# Patient Record
Sex: Male | Born: 1950 | Race: White | Hispanic: No | Marital: Married | Smoking: Never smoker
Health system: Southern US, Community
[De-identification: ages and names within clinical notes are randomized; demographics above are authoritative.]

## PROBLEM LIST (undated history)

## (undated) DIAGNOSIS — I4891 Unspecified atrial fibrillation: Secondary | ICD-10-CM

## (undated) DIAGNOSIS — Z87442 Personal history of urinary calculi: Secondary | ICD-10-CM

## (undated) DIAGNOSIS — M1990 Unspecified osteoarthritis, unspecified site: Secondary | ICD-10-CM

## (undated) DIAGNOSIS — I38 Endocarditis, valve unspecified: Secondary | ICD-10-CM

## (undated) DIAGNOSIS — K219 Gastro-esophageal reflux disease without esophagitis: Secondary | ICD-10-CM

## (undated) DIAGNOSIS — T4145XA Adverse effect of unspecified anesthetic, initial encounter: Secondary | ICD-10-CM

## (undated) DIAGNOSIS — I10 Essential (primary) hypertension: Secondary | ICD-10-CM

## (undated) DIAGNOSIS — K30 Functional dyspepsia: Secondary | ICD-10-CM

## (undated) DIAGNOSIS — E119 Type 2 diabetes mellitus without complications: Secondary | ICD-10-CM

## (undated) DIAGNOSIS — T8859XA Other complications of anesthesia, initial encounter: Secondary | ICD-10-CM

## (undated) HISTORY — DX: Essential (primary) hypertension: I10

## (undated) HISTORY — DX: Functional dyspepsia: K30

## (undated) HISTORY — PX: CARDIAC CATHETERIZATION: SHX172

## (undated) HISTORY — DX: Type 2 diabetes mellitus without complications: E11.9

---

## 2010-01-27 ENCOUNTER — Ambulatory Visit: Payer: Self-pay | Admitting: Cardiovascular Disease

## 2010-06-08 ENCOUNTER — Encounter: Payer: Self-pay | Admitting: Rheumatology

## 2010-06-22 ENCOUNTER — Encounter: Payer: Self-pay | Admitting: Rheumatology

## 2011-02-08 ENCOUNTER — Ambulatory Visit: Payer: Self-pay | Admitting: Orthopedic Surgery

## 2013-05-01 ENCOUNTER — Ambulatory Visit: Payer: Self-pay | Admitting: Pain Medicine

## 2013-05-01 ENCOUNTER — Other Ambulatory Visit: Payer: Self-pay | Admitting: Pain Medicine

## 2013-05-01 LAB — SEDIMENTATION RATE: Erythrocyte Sed Rate: 12 mm/hr (ref 0–20)

## 2013-05-30 ENCOUNTER — Ambulatory Visit: Payer: Self-pay | Admitting: Pain Medicine

## 2013-06-19 ENCOUNTER — Ambulatory Visit: Payer: Self-pay | Admitting: Pain Medicine

## 2014-01-02 ENCOUNTER — Ambulatory Visit: Payer: Self-pay | Admitting: Pain Medicine

## 2014-04-02 ENCOUNTER — Emergency Department: Payer: Self-pay | Admitting: Emergency Medicine

## 2014-04-15 ENCOUNTER — Encounter: Payer: Self-pay | Admitting: *Deleted

## 2014-04-24 ENCOUNTER — Ambulatory Visit (INDEPENDENT_AMBULATORY_CARE_PROVIDER_SITE_OTHER): Payer: BLUE CROSS/BLUE SHIELD | Admitting: General Surgery

## 2014-04-24 ENCOUNTER — Telehealth: Payer: Self-pay | Admitting: *Deleted

## 2014-04-24 ENCOUNTER — Encounter: Payer: Self-pay | Admitting: General Surgery

## 2014-04-24 DIAGNOSIS — K8010 Calculus of gallbladder with chronic cholecystitis without obstruction: Secondary | ICD-10-CM

## 2014-04-24 NOTE — Patient Instructions (Addendum)

## 2014-04-24 NOTE — Progress Notes (Signed)
Patient ID: Connor Stanley, male   DOB: 02/13/1951, 64 y.o.   MRN: [id number]  Chief Complaint  Patient presents with  . Other    gall stones    HPI Connor BrunsVincent Delpino is a 64 y.o. male.  here for evaluation of gall stones. He states that about 3-4 weeks ago he had some vomiting. He states that his tongue was swollen the next morning with sores on it and he went to the ED and states all test were normal. He denies any abdominal pain and no further vomiting. The tongue stayed swollen for about 1 week and is better now. He did have an abdominal ultrasound done 04-09-14 that showed gall stones.  HPI  Past Medical History  Diagnosis Date  . Hypertension   . Acid indigestion     History reviewed. No pertinent past surgical history.  History reviewed. No pertinent family history.  Social History History  Substance Use Topics  . Smoking status: Never Smoker   . Smokeless tobacco: Not on file  . Alcohol Use: No    No Known Allergies  Current Outpatient Prescriptions  Medication Sig Dispense Refill  . carvedilol (COREG) 25 MG tablet Take 25 mg by mouth 2 (two) times daily.  2  . ranitidine (ZANTAC) 150 MG tablet Take 150 mg by mouth 2 (two) times daily.     No current facility-administered medications for this visit.    Review of Systems Review of Systems  Constitutional: Negative.   Respiratory: Negative.   Cardiovascular: Negative.     Blood pressure 120/68, pulse 60, resp. rate 12, height 5' 9.5" (1.765 m), weight 238 lb (107.956 kg).  Physical Exam Physical Exam  Constitutional: He is oriented to person, place, and time. He appears well-developed and well-nourished.  Eyes: Conjunctivae are normal. No scleral icterus.  Neck: Neck supple.  Cardiovascular: Normal rate, regular rhythm and normal heart sounds.   Pulmonary/Chest: Effort normal and breath sounds normal.  Abdominal: Soft. Normal appearance and bowel sounds are normal. There is no hepatomegaly. There is no  tenderness.  Lymphadenopathy:    He has no cervical adenopathy.  Neurological: He is alert and oriented to person, place, and time.  Skin: Skin is warm and dry.    Data Reviewed Office notes.  Assessment     Cholelithiasis with chronic cholecystitis. Pt has not had significant symptoms but he does have moderate wall thickening of gallbladder and multiple stones.  Elective cholecystectomy would be a lot easier than one done urgently in this moderately overweight patient. Gave him all details     Plan    Laparoscopic Cholecystectomy with Intraoperative Cholangiogram. The procedure, including it's potential risks and complications (including but not limited to infection, bleeding, injury to intra-abdominal organs or bile ducts, bile leak, poor cosmetic result, sepsis and death) were discussed with the patient in detail. Non-operative options, including their inherent risks (acute calculous cholecystitis with possible choledocholithiasis or gallstone pancreatitis, with the risk of ascending cholangitis, sepsis, and death) were discussed as well. The patient expressed and understanding of what we discussed and wishes to proceed with laparoscopic cholecystectomy. The patient further understands that if it is technically not possible, or it is unsafe to proceed laparoscopically, that I will convert to an open cholecystectomy.    Patient's [facility].   Stetson Pelaez G 04/24/2014, 2:08 PM

## 2014-04-24 NOTE — Telephone Encounter (Signed)
Patient's wife called the office wanting to reschedule surgery from 05-01-14 to 04-29-14.   Message left on O.R. Posting line regarding date change.

## 2014-04-25 LAB — HEPATIC FUNCTION PANEL
ALK PHOS: 96 IU/L (ref 39–117)
ALT: 29 IU/L (ref 0–44)
AST: 22 IU/L (ref 0–40)
Albumin: 4.4 g/dL (ref 3.6–4.8)
BILIRUBIN TOTAL: 0.7 mg/dL (ref 0.0–1.2)
Bilirubin, Direct: 0.17 mg/dL (ref 0.00–0.40)
Total Protein: 6.9 g/dL (ref 6.0–8.5)

## 2014-04-29 ENCOUNTER — Ambulatory Visit: Payer: Self-pay | Admitting: General Surgery

## 2014-04-29 ENCOUNTER — Encounter: Payer: Self-pay | Admitting: General Surgery

## 2014-04-29 DIAGNOSIS — K8010 Calculus of gallbladder with chronic cholecystitis without obstruction: Secondary | ICD-10-CM | POA: Diagnosis not present

## 2014-04-29 HISTORY — PX: CHOLECYSTECTOMY: SHX55

## 2014-05-01 ENCOUNTER — Encounter: Payer: Self-pay | Admitting: General Surgery

## 2014-05-02 ENCOUNTER — Encounter: Payer: Self-pay | Admitting: General Surgery

## 2014-05-08 ENCOUNTER — Ambulatory Visit (INDEPENDENT_AMBULATORY_CARE_PROVIDER_SITE_OTHER): Payer: BLUE CROSS/BLUE SHIELD | Admitting: General Surgery

## 2014-05-08 ENCOUNTER — Encounter: Payer: Self-pay | Admitting: General Surgery

## 2014-05-08 VITALS — BP 130/70 | HR 74 | Resp 14 | Ht 69.0 in | Wt 236.0 lb

## 2014-05-08 DIAGNOSIS — K8010 Calculus of gallbladder with chronic cholecystitis without obstruction: Secondary | ICD-10-CM

## 2014-05-08 NOTE — Progress Notes (Signed)
This is a 64 year old male here today for his post op gallbladder surgery done on 04/29/14. Patient states he is doing well.  Port sites are clean and healing well. Abdomen is soft, nontender Lungs are clear. Patient to return as needed. No restrictions.

## 2014-05-08 NOTE — Patient Instructions (Signed)
Patient tto return as needed.

## 2014-06-16 LAB — SURGICAL PATHOLOGY

## 2014-06-22 NOTE — Op Note (Signed)
PATIENT NAME:  Connor Stanley, Connor Stanley MR#:  [id number] DATE OF BIRTH:  December 05, 1950  DATE OF PROCEDURE:  04/29/2014  PREOPERATIVE DIAGNOSIS: Chronic cholecystitis and cholelithiasis.   POSTOPERATIVE DIAGNOSIS: Chronic cholecystitis and cholelithiasis.   OPERATION: Laparoscopy and cholecystectomy with intraoperative cholangiogram.   ANESTHESIA: General.   COMPLICATIONS: None.   ESTIMATED BLOOD LOSS: Minimal.   DRAINS: None.   DESCRIPTION OF PROCEDURE: The patient was put to sleep in the supine position on the operating table. The abdomen was prepped and draped out as a sterile field.   Timeout was performed. The port site was made just above the umbilicus with Veress needle, was inserted through the incision into the peritoneal cavity and verified with the hanging drop method. Pneumoperitoneum was obtained and 10 mm port was placed. The camera was introduced with good visualization of the peritoneal cavity. No apparent injury to the underlying structures from initial entry noted. Epigastric and 2 lateral 5 mm ports were placed. The gallbladder was noted to be chronically inflamed with significantly thickened wall, no acute changes were noted. With careful cephalad traction the adhesions were taken down. The Hartmann pouch area was dissected. The cystic duct was freed and a Kumar clamp and catheter were positioned. Cholangiogram was performed which showed good filling of the bile duct both proximal and distal, no apparent filling defects were identified, and no obstruction to flow. The catheter was used to decompress the gallbladder slightly and removed. The cystic duct was hemoclipped, and cut. The cystic artery was identified in its normal position. This was freed, hemoclipped, and cut. The gallbladder was dissected free from its bed using cautery for control of bleeding. With the 5 mm scope used on the epigastric port site the gallbladder was placed in a retrieval bag and brought out through the umbilical  port site. Multiple stones were identified. There was no unusual bleeding. Clips were intact. The fascial opening in the umbilicus was closed with 0 Vicryl placed with a suture passer. A small amount of fluid was used to irrigate out the right upper quadrant and suctioned out. Ports were then removed after release of pneumoperitoneum. The skin incision closed with subcuticular 4-0 Vicryl, reinforced with Steri-Strips, and a dry sterile dressing was placed. The patient tolerated the procedure well. There were no immediate problems encountered. He was extubated and returned to the recovery room in stable condition.      ____________________________ S.Wynona LunaG. Sankar, MD sgs:bu D: 05/01/2014 09:22:31 ET T: 05/01/2014 17:02:16 ET JOB#: [id number]  cc: S.G. Evette CristalSankar, MD, <Dictator> [facility] Wynona LunaG SANKAR MD ELECTRONICALLY SIGNED 05/05/2014 9:55

## 2017-04-18 ENCOUNTER — Other Ambulatory Visit: Payer: Self-pay | Admitting: Specialist

## 2017-04-20 ENCOUNTER — Encounter
Admission: RE | Admit: 2017-04-20 | Discharge: 2017-04-20 | Disposition: A | Discharge status: Home / Self Care | Payer: BLUE CROSS/BLUE SHIELD | Source: Ambulatory Visit | Attending: Specialist | Admitting: Specialist

## 2017-04-20 ENCOUNTER — Other Ambulatory Visit: Payer: Self-pay

## 2017-04-20 HISTORY — DX: Unspecified osteoarthritis, unspecified site: M19.90

## 2017-04-20 HISTORY — DX: Gastro-esophageal reflux disease without esophagitis: K21.9

## 2017-04-20 HISTORY — DX: Personal history of urinary calculi: Z87.442

## 2017-04-20 HISTORY — DX: Adverse effect of unspecified anesthetic, initial encounter: T41.45XA

## 2017-04-20 HISTORY — DX: Endocarditis, valve unspecified: I38

## 2017-04-20 HISTORY — DX: Other complications of anesthesia, initial encounter: T88.59XA

## 2017-04-20 NOTE — Patient Instructions (Signed)
Your procedure is scheduled on: 04-24-17 Report to Same Day Surgery 2nd floor medical mall [facility] Entrance-take elevator on left to 2nd floor.  Check in with surgery information desk.) To find out your arrival time please call [phone] between 1PM - 3PM on 04-21-17  Remember: Instructions that are not followed completely may result in serious medical risk, up to and including death, or upon the discretion of your surgeon and anesthesiologist your surgery may need to be rescheduled.    _x___ 1. Do not eat food after midnight the night before your procedure. NO GUM OR CANDY AFTER MIDNIGHT.  You may drink clear liquids up to 2 hours before you are scheduled to arrive at the hospital for your procedure.  Do not drink clear liquids within 2 hours of your scheduled arrival to the hospital.  Clear liquids include  --Water or Apple juice without pulp  --Clear carbohydrate beverage such as ClearFast or Gatorade  --Black Coffee or Clear Tea (No milk, no creamers, do not add anything to the coffee or Tea     __x__ 2. No Alcohol for 24 hours before or after surgery.   __x__3. No Smoking or e-cigarettes for 24 prior to surgery.  Do not use any chewable tobacco products for at least 6 hour prior to surgery   ____  4. Bring all medications with you on the day of surgery if instructed.    __x__ 5. Notify your doctor if there is any change in your medical condition     (cold, fever, infections).    x___6. On the morning of surgery brush your teeth with toothpaste and water.  You may rinse your mouth with mouth wash if you wish.  Do not swallow any toothpaste or mouthwash.   Do not wear jewelry, make-up, hairpins, clips or nail polish.  Do not wear lotions, powders, or perfumes. You may wear deodorant.  Do not shave 48 hours prior to surgery. Men may shave face and neck.  Do not bring valuables to the hospital.    [facility] is not responsible for any belongings or valuables.    Contacts, dentures or bridgework may not be worn into surgery.  Leave your suitcase in the car. After surgery it may be brought to your room.  For patients admitted to the hospital, discharge time is determined by your treatment team.  _  Patients discharged the day of surgery will not be allowed to drive home.  You will need someone to drive you home and stay with you the night of your procedure.    Please read over the following fact sheets that you were given:   [facility] Preparing for Surgery and or MRSA Information   _x___ TAKE THE FOLLOWING MEDICATION THE MORNING OF SURGERY WITH A SMALL SIP OF WATER. These include:  1. COREG  2.  3.  4.  5.  6.  ____Fleets enema or Magnesium Citrate as directed.   _x___ Use CHG Soap or sage wipes as directed on instruction sheet   ____ Use inhalers on the day of surgery and bring to hospital day of surgery  ____ Stop Metformin and Janumet 2 days prior to surgery.    ____ Take 1/2 of usual insulin dose the night before surgery and none on the morning surgery.   ____ Follow recommendations from Cardiologist, Pulmonologist or PCP regarding  stopping Aspirin, Coumadin, Plavix ,Eliquis, Effient, or Pradaxa, and Pletal.  X____Stop Anti-inflammatories such as Advil, Aleve, Ibuprofen, Motrin, Naproxen,MELOXICAM,  Naprosyn,  Goodies powders or aspirin products NOW-OK to take Tylenol    ____ Stop supplements until after surgery.     ____ Bring C-Pap to the hospital.

## 2017-04-24 ENCOUNTER — Encounter: Admission: RE | Payer: Self-pay | Source: Ambulatory Visit

## 2017-04-24 ENCOUNTER — Ambulatory Visit: Admission: RE | Admit: 2017-04-24 | Payer: BLUE CROSS/BLUE SHIELD | Source: Ambulatory Visit | Admitting: Specialist

## 2017-04-24 SURGERY — CARPOMETACARPAL (CMC) FUSION OF THUMB
Anesthesia: Choice | Laterality: Left

## 2017-05-23 ENCOUNTER — Other Ambulatory Visit: Payer: Self-pay

## 2017-05-23 ENCOUNTER — Emergency Department: Payer: BLUE CROSS/BLUE SHIELD

## 2017-05-23 ENCOUNTER — Observation Stay
Admission: EM | Admit: 2017-05-23 | Discharge: 2017-05-24 | Disposition: A | Discharge status: Home / Self Care | Payer: BLUE CROSS/BLUE SHIELD | Attending: Internal Medicine | Admitting: Internal Medicine

## 2017-05-23 ENCOUNTER — Encounter: Payer: Self-pay | Admitting: Emergency Medicine

## 2017-05-23 DIAGNOSIS — Z8249 Family history of ischemic heart disease and other diseases of the circulatory system: Secondary | ICD-10-CM | POA: Diagnosis not present

## 2017-05-23 DIAGNOSIS — I480 Paroxysmal atrial fibrillation: Secondary | ICD-10-CM | POA: Diagnosis not present

## 2017-05-23 DIAGNOSIS — K219 Gastro-esophageal reflux disease without esophagitis: Secondary | ICD-10-CM | POA: Diagnosis not present

## 2017-05-23 DIAGNOSIS — E860 Dehydration: Secondary | ICD-10-CM | POA: Insufficient documentation

## 2017-05-23 DIAGNOSIS — Z791 Long term (current) use of non-steroidal anti-inflammatories (NSAID): Secondary | ICD-10-CM | POA: Insufficient documentation

## 2017-05-23 DIAGNOSIS — J101 Influenza due to other identified influenza virus with other respiratory manifestations: Secondary | ICD-10-CM | POA: Diagnosis present

## 2017-05-23 DIAGNOSIS — J188 Other pneumonia, unspecified organism: Secondary | ICD-10-CM | POA: Insufficient documentation

## 2017-05-23 DIAGNOSIS — R2681 Unsteadiness on feet: Secondary | ICD-10-CM

## 2017-05-23 DIAGNOSIS — R42 Dizziness and giddiness: Secondary | ICD-10-CM | POA: Insufficient documentation

## 2017-05-23 DIAGNOSIS — J1000 Influenza due to other identified influenza virus with unspecified type of pneumonia: Principal | ICD-10-CM | POA: Insufficient documentation

## 2017-05-23 DIAGNOSIS — N289 Disorder of kidney and ureter, unspecified: Secondary | ICD-10-CM

## 2017-05-23 DIAGNOSIS — R269 Unspecified abnormalities of gait and mobility: Secondary | ICD-10-CM | POA: Insufficient documentation

## 2017-05-23 DIAGNOSIS — I10 Essential (primary) hypertension: Secondary | ICD-10-CM | POA: Insufficient documentation

## 2017-05-23 DIAGNOSIS — N179 Acute kidney failure, unspecified: Secondary | ICD-10-CM

## 2017-05-23 DIAGNOSIS — Z87442 Personal history of urinary calculi: Secondary | ICD-10-CM | POA: Diagnosis not present

## 2017-05-23 DIAGNOSIS — J189 Pneumonia, unspecified organism: Secondary | ICD-10-CM

## 2017-05-23 HISTORY — DX: Unspecified atrial fibrillation: I48.91

## 2017-05-23 LAB — COMPREHENSIVE METABOLIC PANEL
ALK PHOS: 71 U/L (ref 38–126)
ALT: 28 U/L (ref 17–63)
AST: 31 U/L (ref 15–41)
Albumin: 4 g/dL (ref 3.5–5.0)
Anion gap: 7 (ref 5–15)
BUN: 17 mg/dL (ref 6–20)
CALCIUM: 8.5 mg/dL — AB (ref 8.9–10.3)
CHLORIDE: 106 mmol/L (ref 101–111)
CO2: 23 mmol/L (ref 22–32)
Creatinine, Ser: 1.26 mg/dL — ABNORMAL HIGH (ref 0.61–1.24)
GFR calc Af Amer: >60 mL/min (ref >60)
GFR calc non Af Amer: 57 mL/min — ABNORMAL LOW (ref >60)
Glucose, Bld: 137 mg/dL — ABNORMAL HIGH (ref 65–99)
Potassium: 3.6 mmol/L (ref 3.5–5.1)
SODIUM: 136 mmol/L (ref 135–145)
Total Bilirubin: 1.1 mg/dL (ref 0.3–1.2)
Total Protein: 7.1 g/dL (ref 6.5–8.1)

## 2017-05-23 LAB — CBC WITH DIFFERENTIAL/PLATELET
BASOS ABS: 0 10*3/uL (ref 0–0.1)
Basophils Relative: 0 %
EOS ABS: 0 10*3/uL (ref 0–0.7)
Eosinophils Relative: 0 %
HCT: 39.4 % — ABNORMAL LOW (ref 40.0–52.0)
HEMOGLOBIN: 13.4 g/dL (ref 13.0–18.0)
LYMPHS ABS: 0.5 10*3/uL — AB (ref 1.0–3.6)
LYMPHS PCT: 6 %
MCH: 30 pg (ref 26.0–34.0)
MCHC: 33.9 g/dL (ref 32.0–36.0)
MCV: 88.5 fL (ref 80.0–100.0)
Monocytes Absolute: 0.9 10*3/uL (ref 0.2–1.0)
Monocytes Relative: 11 %
NEUTROS PCT: 83 %
Neutro Abs: 6.9 10*3/uL — ABNORMAL HIGH (ref 1.4–6.5)
PLATELETS: 147 10*3/uL — AB (ref 150–440)
RBC: 4.46 MIL/uL (ref 4.40–5.90)
RDW: 13.5 % (ref 11.5–14.5)
WBC: 8.3 10*3/uL (ref 3.8–10.6)

## 2017-05-23 LAB — URINALYSIS, COMPLETE (UACMP) WITH MICROSCOPIC
BACTERIA UA: NONE SEEN
Bilirubin Urine: NEGATIVE
GLUCOSE, UA: NEGATIVE mg/dL
Hgb urine dipstick: NEGATIVE
KETONES UR: NEGATIVE mg/dL
Leukocytes, UA: NEGATIVE
Nitrite: NEGATIVE
PROTEIN: 30 mg/dL — AB
Specific Gravity, Urine: >1.046 — ABNORMAL HIGH (ref 1.005–1.030)
pH: 5 (ref 5.0–8.0)

## 2017-05-23 LAB — LACTIC ACID, PLASMA: Lactic Acid, Venous: 0.9 mmol/L (ref 0.5–1.9)

## 2017-05-23 LAB — INFLUENZA PANEL BY PCR (TYPE A & B)
INFLAPCR: POSITIVE — AB
INFLBPCR: NEGATIVE

## 2017-05-23 LAB — TROPONIN I: Troponin I: <0.03 ng/mL (ref <0.03)

## 2017-05-23 MED ORDER — MECLIZINE HCL 25 MG PO TABS
50.0000 mg | ORAL_TABLET | Freq: Once | ORAL | Status: AC
  Administered 2017-05-23: 50 mg via ORAL
  Filled 2017-05-23: qty 2

## 2017-05-23 MED ORDER — SODIUM CHLORIDE 0.9 % IV BOLUS
1000.0000 mL | Freq: Once | INTRAVENOUS | Status: AC
  Administered 2017-05-23: 1000 mL via INTRAVENOUS

## 2017-05-23 MED ORDER — MELOXICAM 7.5 MG PO TABS
7.5000 mg | ORAL_TABLET | Freq: Two times a day (BID) | ORAL | Status: DC
  Administered 2017-05-24: 7.5 mg via ORAL
  Filled 2017-05-23 (×3): qty 1

## 2017-05-23 MED ORDER — IOHEXOL 350 MG/ML SOLN
75.0000 mL | Freq: Once | INTRAVENOUS | Status: AC | PRN
  Administered 2017-05-23: 75 mL via INTRAVENOUS
  Filled 2017-05-23: qty 75

## 2017-05-23 MED ORDER — LEVOFLOXACIN 500 MG PO TABS: 500.0000 mg | ORAL_TABLET | Freq: Every day | ORAL | Status: DC

## 2017-05-23 MED ORDER — ACETAMINOPHEN 500 MG PO TABS
ORAL_TABLET | ORAL | Status: AC
  Administered 2017-05-23: 1000 mg
  Filled 2017-05-23: qty 2

## 2017-05-23 MED ORDER — RIVAROXABAN 20 MG PO TABS
20.0000 mg | ORAL_TABLET | Freq: Every day | ORAL | Status: DC
  Administered 2017-05-24: 10:00:00 20 mg via ORAL
  Filled 2017-05-23: qty 1

## 2017-05-23 MED ORDER — ACETAMINOPHEN 325 MG PO TABS: 650.0000 mg | ORAL_TABLET | Freq: Four times a day (QID) | ORAL | Status: DC | PRN

## 2017-05-23 MED ORDER — OSELTAMIVIR PHOSPHATE 75 MG PO CAPS
75.0000 mg | ORAL_CAPSULE | Freq: Two times a day (BID) | ORAL | Status: DC
  Administered 2017-05-24: 10:00:00 75 mg via ORAL
  Filled 2017-05-23 (×2): qty 1

## 2017-05-23 MED ORDER — SODIUM CHLORIDE 0.9 % IV SOLN
INTRAVENOUS | Status: DC
  Administered 2017-05-23: 21:00:00 via INTRAVENOUS

## 2017-05-23 MED ORDER — LEVOFLOXACIN IN D5W 750 MG/150ML IV SOLN
750.0000 mg | Freq: Once | INTRAVENOUS | Status: AC
  Administered 2017-05-23: 750 mg via INTRAVENOUS
  Filled 2017-05-23: qty 150

## 2017-05-23 MED ORDER — IOPAMIDOL (ISOVUE-370) INJECTION 76%
75.0000 mL | Freq: Once | INTRAVENOUS | Status: DC | PRN
  Filled 2017-05-23: qty 75

## 2017-05-23 MED ORDER — DOCUSATE SODIUM 100 MG PO CAPS
100.0000 mg | ORAL_CAPSULE | Freq: Two times a day (BID) | ORAL | Status: DC | PRN
Start: 2017-05-23 — End: 2017-05-24

## 2017-05-23 MED ORDER — OSELTAMIVIR PHOSPHATE 75 MG PO CAPS
75.0000 mg | ORAL_CAPSULE | Freq: Once | ORAL | Status: AC
  Administered 2017-05-23: 75 mg via ORAL
  Filled 2017-05-23: qty 1

## 2017-05-23 NOTE — ED Provider Notes (Signed)
[facility] Emergency Department Provider Note  ____________________________________________  Time seen: Approximately 5:16 PM  I have reviewed the triage vital signs and the nursing notes.   HISTORY  Chief Complaint Dizziness    HPI Connor Stanley is a 67 y.o. male with a fairly new diagnosis of atrial fibrillation on carvedilol and Xarelto, HTN, presenting with lightheadedness, difficulty walking, cough and cold symptoms.  The patient reports that for the past several days he has had a "cold" with congestion and nonproductive cough.  He is also intermittently had subjective fevers which are responsive to Tylenol.  He has had decreased p.o. intake because of his symptoms.  He reports that he has developed a lightheaded sensation that is worse if he stands up quickly, or if he bends down and then stands up quickly.  He has not had room spinning sensation.  He feels that he is walking "like I am drunk," banging into the wall wherever he goes.  He has not had any nausea vomiting or diarrhea, urinary symptoms, abdominal pain.  Patient denies any palpitations, syncope, chest pain or shortness of breath.  Past Medical History:  Diagnosis Date  . A-fib (HCC)   . Acid indigestion   . Arthritis   . Complication of anesthesia    HARD TO WAKE UP  . GERD (gastroesophageal reflux disease)    RARE-NO MEDS  . History of kidney stones    H/O  . Hypertension   . Leaky heart valve    PT STATES HE HAD HEART CATH AND IT SHOWED ONE OF HIS VALVES DID NOT CLOSE PROPERLY-    There are no active problems to display for this patient.   Past Surgical History:  Procedure Laterality Date  . CARDIAC CATHETERIZATION    . CHOLECYSTECTOMY  04/29/14    Current Outpatient Rx  . Order #: [id number] Class: Historical Med  . Order #: [id number] Class: Historical Med  . Order #: [id number] Class: Historical Med  . Order #: [id number] Class: Historical Med    Allergies Patient has no known  allergies.  History reviewed. No pertinent family history.  Social History Social History   Tobacco Use  . Smoking status: Never Smoker  . Smokeless tobacco: Never Used  Substance Use Topics  . Alcohol use: No    Alcohol/week: 0.0 oz  . Drug use: No    Review of Systems Constitutional: Positive fevers.  Positive lightheadedness without syncope.  No dizziness. Eyes: No visual changes.  No eye discharge. ENT: No sore throat.  Positive congestion or rhinorrhea.  No ear pain. Cardiovascular: Denies chest pain. Denies palpitations. Respiratory: Denies shortness of breath.  Positive cough. Gastrointestinal: No abdominal pain.  No nausea, no vomiting.  No diarrhea.  No constipation. Genitourinary: Negative for dysuria. Musculoskeletal: Negative for back pain. Skin: Negative for rash. Neurological: Negative for headaches. No focal numbness, tingling or weakness.  Positive difficulty walking due to unsteady gait.    ____________________________________________   PHYSICAL EXAM:  VITAL SIGNS: ED Triage Vitals  Enc Vitals Group     BP 05/23/17 1334 120/70     Pulse Rate 05/23/17 1334 82     Resp 05/23/17 1334 16     Temp 05/23/17 1334 99.5 F (37.5 C)     Temp Source 05/23/17 1334 Oral     SpO2 05/23/17 1334 98 %     Weight 05/23/17 1330 230 lb (104.3 kg)     Height 05/23/17 1330 5\' 9"  (1.753 m)     Head Circumference --  Peak Flow --      Pain Score 05/23/17 1335 0     Pain Loc --      Pain Edu? --      Excl. in GC? --     Constitutional: Alert and oriented.  Conically ill appearing but in no acute distress. Answers questions appropriately. Eyes: Conjunctivae are normal.  EOMI. PERRLA.  No horizontal or vertical nystagmus.  No scleral icterus. Head: Atraumatic. Nose: No congestion/rhinnorhea. Mouth/Throat: Mucous membranes are mildly dry.  Neck: No stridor.  Supple.  No JVD.  No meningismus. Cardiovascular: Normal rate, regular rhythm. No murmurs, rubs or  gallops.  Respiratory: Normal respiratory effort.  No accessory muscle use or retractions. Lungs CTAB.  No wheezes, rales or ronchi. Gastrointestinal: Overweight.  Soft, nontender and nondistended.  No guarding or rebound.  No peritoneal signs. Musculoskeletal: No LE edema. No ttp in the calves or palpable cords.  Negative Homan's sign. Neurologic: Alert and oriented 3. Speech is clear.  Face and smile symmetric. Tongue deviates slightly towards the left.  EOMI.  PERRLA.  No horizontal or vertical nystagmus.   No pronator drift. 5 out of 5 grip, biceps, triceps, hip flexors, plantar flexion and dorsiflexion. Normal sensation to light touch in the bilateral upper and lower extremities, and face.  Abnormal gait with some unsteadiness when the patient turns around. Skin:  Skin is warm, dry and intact. No rash noted. Psychiatric: Mood and affect are normal. Speech and behavior are normal.  Normal judgement  ____________________________________________   LABS (all labs ordered are listed, but only abnormal results are displayed)  Labs Reviewed  CBC WITH DIFFERENTIAL/PLATELET - Abnormal; Notable for the following components:      Result Value   HCT 39.4 (*)    Platelets 147 (*)    Neutro Abs 6.9 (*)    Lymphs Abs 0.5 (*)    All other components within normal limits  COMPREHENSIVE METABOLIC PANEL - Abnormal; Notable for the following components:   Glucose, Bld 137 (*)    Creatinine, Ser 1.26 (*)    Calcium 8.5 (*)    GFR calc non Af Amer 57 (*)    All other components within normal limits  INFLUENZA PANEL BY PCR (TYPE A & B) - Abnormal; Notable for the following components:   Influenza A By PCR POSITIVE (*)    All other components within normal limits  CULTURE, BLOOD (ROUTINE X 2)  CULTURE, BLOOD (ROUTINE X 2)  TROPONIN I  URINALYSIS, COMPLETE (UACMP) WITH MICROSCOPIC  LACTIC ACID, PLASMA  LACTIC ACID, PLASMA   ____________________________________________  EKG  ED ECG REPORT I,  Rockne Menghini, the attending physician, personally viewed and interpreted this ECG.   Date: 05/23/2017  EKG Time: 1344  Rate: 79  Rhythm: normal sinus rhythm  Axis: normal  Intervals:none  ST&T Change: No STEMI  ____________________________________________  RADIOLOGY  Ct Angio Head W Or Wo Contrast  Result Date: 05/23/2017 CLINICAL DATA:  67 y/o M; intermittent issues with dizziness and balance for 2-3 days. EXAM: CT ANGIOGRAPHY HEAD AND NECK TECHNIQUE: Multidetector CT imaging of the head and neck was performed using the standard protocol during bolus administration of intravenous contrast. Multiplanar CT image reconstructions and MIPs were obtained to evaluate the vascular anatomy. Carotid stenosis measurements (when applicable) are obtained utilizing NASCET criteria, using the distal internal carotid diameter as the denominator. CONTRAST:  75 cc Isovue 350. COMPARISON:  None. FINDINGS: CT HEAD FINDINGS Brain: No evidence of acute infarction, hemorrhage, hydrocephalus, extra-axial collection  or mass lesion/mass effect. Several nonspecific foci of hypoattenuation in subcortical white matter are are compatible with moderate chronic microvascular ischemic changes for age. Mild brain parenchymal volume loss. Vascular: No hyperdense vessel or unexpected calcification. Skull: Normal. Negative for fracture or focal lesion. Sinuses: Mild ethmoid and left maxillary sinus mucosal thickening and small mucous retention cyst in the left maxillary sinus. Normal aeration of mastoid air cells. Orbits: No acute finding. Review of the MIP images confirms the above findings CTA NECK FINDINGS Aortic arch: Standard branching. Imaged portion shows no evidence of aneurysm or dissection. No significant stenosis of the major arch vessel origins. Mild calcific atherosclerosis. Right carotid system: No evidence of dissection, stenosis (50% or greater) or occlusion. Left carotid system: No evidence of dissection,  stenosis (50% or greater) or occlusion. Vertebral arteries: Codominant. No evidence of dissection, stenosis (50% or greater) or occlusion. Skeleton: Cervical spondylosis with prominent discogenic degenerative changes greatest at the C6-7 level. Uncovertebral and facet hypertrophy results in right-sided bony foraminal stenosis at C5-C7 and left-sided stenosis at C6-7. Additionally, there is a large C6-7 disc osteophyte complex with approximately moderate canal stenosis. Other neck: 13 mm nodule in right lobe of thyroid. Upper chest: Negative. Review of the MIP images confirms the above findings CTA HEAD FINDINGS Anterior circulation: No significant stenosis, proximal occlusion, aneurysm, or vascular malformation. Posterior circulation: No significant stenosis, proximal occlusion, aneurysm, or vascular malformation. Venous sinuses: As permitted by contrast timing, patent. Anatomic variants: Complete circle-of-Willis. Delayed phase: No abnormal intracranial enhancement. Review of the MIP images confirms the above findings IMPRESSION: 1. No acute intracranial abnormality identified. No abnormal enhancement of the brain. 2. Patent carotid and vertebral arteries. No dissection, aneurysm, or hemodynamically significant stenosis utilizing NASCET criteria. 3. Patent anterior and posterior intracranial circulation. No large vessel occlusion, aneurysm, or significant stenosis. 4. Moderate chronic microvascular ischemic changes and mild parenchymal volume loss of the brain. 5. Cervical spondylosis greatest at C6-7 where there is approximately moderate canal stenosis and bilateral foraminal stenosis. Electronically Signed   By: Mitzi Hansen M.D.   On: 05/23/2017 18:24   Dg Chest 2 View  Result Date: 05/23/2017 CLINICAL DATA:  Cough EXAM: CHEST - 2 VIEW COMPARISON:  None. FINDINGS: Normal heart size. Normal mediastinal contour. No pneumothorax. No pleural effusion. Mild patchy lingular opacity obscuring the left  heart border. Curvilinear opacities at both lung bases. No pulmonary edema. No acute consolidative airspace disease. IMPRESSION: Mild patchy lingular opacity obscuring the left heart border, suggesting lingular pneumonia. Recommend follow-up PA and lateral post treatment chest radiographs in 4-6 weeks. Curvilinear bibasilar lung opacities compatible with mild scarring or atelectasis. Electronically Signed   By: Delbert Phenix M.D.   On: 05/23/2017 18:15   Ct Angio Neck W And/or Wo Contrast  Result Date: 05/23/2017 CLINICAL DATA:  67 y/o M; intermittent issues with dizziness and balance for 2-3 days. EXAM: CT ANGIOGRAPHY HEAD AND NECK TECHNIQUE: Multidetector CT imaging of the head and neck was performed using the standard protocol during bolus administration of intravenous contrast. Multiplanar CT image reconstructions and MIPs were obtained to evaluate the vascular anatomy. Carotid stenosis measurements (when applicable) are obtained utilizing NASCET criteria, using the distal internal carotid diameter as the denominator. CONTRAST:  75 cc Isovue 350. COMPARISON:  None. FINDINGS: CT HEAD FINDINGS Brain: No evidence of acute infarction, hemorrhage, hydrocephalus, extra-axial collection or mass lesion/mass effect. Several nonspecific foci of hypoattenuation in subcortical white matter are are compatible with moderate chronic microvascular ischemic changes for age. Mild brain  parenchymal volume loss. Vascular: No hyperdense vessel or unexpected calcification. Skull: Normal. Negative for fracture or focal lesion. Sinuses: Mild ethmoid and left maxillary sinus mucosal thickening and small mucous retention cyst in the left maxillary sinus. Normal aeration of mastoid air cells. Orbits: No acute finding. Review of the MIP images confirms the above findings CTA NECK FINDINGS Aortic arch: Standard branching. Imaged portion shows no evidence of aneurysm or dissection. No significant stenosis of the major arch vessel origins.  Mild calcific atherosclerosis. Right carotid system: No evidence of dissection, stenosis (50% or greater) or occlusion. Left carotid system: No evidence of dissection, stenosis (50% or greater) or occlusion. Vertebral arteries: Codominant. No evidence of dissection, stenosis (50% or greater) or occlusion. Skeleton: Cervical spondylosis with prominent discogenic degenerative changes greatest at the C6-7 level. Uncovertebral and facet hypertrophy results in right-sided bony foraminal stenosis at C5-C7 and left-sided stenosis at C6-7. Additionally, there is a large C6-7 disc osteophyte complex with approximately moderate canal stenosis. Other neck: 13 mm nodule in right lobe of thyroid. Upper chest: Negative. Review of the MIP images confirms the above findings CTA HEAD FINDINGS Anterior circulation: No significant stenosis, proximal occlusion, aneurysm, or vascular malformation. Posterior circulation: No significant stenosis, proximal occlusion, aneurysm, or vascular malformation. Venous sinuses: As permitted by contrast timing, patent. Anatomic variants: Complete circle-of-Willis. Delayed phase: No abnormal intracranial enhancement. Review of the MIP images confirms the above findings IMPRESSION: 1. No acute intracranial abnormality identified. No abnormal enhancement of the brain. 2. Patent carotid and vertebral arteries. No dissection, aneurysm, or hemodynamically significant stenosis utilizing NASCET criteria. 3. Patent anterior and posterior intracranial circulation. No large vessel occlusion, aneurysm, or significant stenosis. 4. Moderate chronic microvascular ischemic changes and mild parenchymal volume loss of the brain. 5. Cervical spondylosis greatest at C6-7 where there is approximately moderate canal stenosis and bilateral foraminal stenosis. Electronically Signed   By: Mitzi Hansen M.D.   On: 05/23/2017 18:24    ____________________________________________   PROCEDURES  Procedure(s)  performed: None  Procedures  Critical Care performed: No ____________________________________________   INITIAL IMPRESSION / ASSESSMENT AND PLAN / ED COURSE  Pertinent labs & imaging results that were available during my care of the patient were reviewed by me and considered in my medical decision making (see chart for details).  67 y.o. male with A. fib on Xarelto presenting with lightheadedness, unsteady gait, in the setting of cough and cold symptoms.  Overall, it is possible that the patient became hypovolemic due to decreased p.o. intake in the setting of a viral illness.  We will get a chest x-ray to rule out pneumonia and influenza swab to check for flu.  Will also rule out UTI.  However, when I ambulate the patient, he was unsteady in his gait, so we will get a CT of the head and CT angiogram of the head neck to rule out stroke.  The patient's EKG is reassuring without any evidence of arrhythmia and he has a negative troponin; the likelihood of ACS or MI is extremely low.  The patient is on Xarelto but has no evidence of anemia which could be causing his symptoms today.  I will plan to treat the patient symptomatically, and reevaluate the patient for final disposition.  ----------------------------------------- 6:42 PM on 05/23/2017 -----------------------------------------  The patient is positive for influenza A and also has a lingular pneumonia.  He has renal insufficiency today and gait instability with walking.  While here, he developed a fever to 103.3.  He does meet criteria  for inpatient hospitalization, blood cultures will be drawn now that we have this new information and the fever, and the patient will be admitted to the hospitalist.  He will receive Tamiflu and Levaquin for his infections.  ____________________________________________  FINAL CLINICAL IMPRESSION(S) / ED DIAGNOSES  Final diagnoses:  Influenza A  Lingular pneumonia  Gait instability  Acute renal  insufficiency         NEW MEDICATIONS STARTED DURING THIS VISIT:  New Prescriptions   No medications on file      Rockne Menghini, MD 05/23/17 1843

## 2017-05-23 NOTE — H&P (Signed)
[facility] [place] [facility]   PATIENT NAME: Connor Stanley    MR#:  [id number]  DATE OF BIRTH:  07/12/1950  DATE OF ADMISSION:  05/23/2017  PRIMARY CARE PHYSICIAN: Sherron Monday, MD   REQUESTING/REFERRING PHYSICIAN: Sharma Covert  CHIEF COMPLAINT:   Chief Complaint  Patient presents with  . Dizziness    HISTORY OF PRESENT ILLNESS: Connor Stanley  is a 67 y.o. male with a known history of A. Fib, Htn, GERD, had cough, fever, chills for last 2 days. He did not eat much in last 2 days because of not feeling well. Came to emergency room today as the complaint continues and he started feeling somewhat dizzy. On workup in ER he is noted to have influenza A and pneumonia on chest x-ray and has mild renal insufficiency. His orthostatic vital signs are stable but on trial due to getting up and walk in the room he felt extremely dizzy and was about to fall while ER physician was assessing him. Concerned with this she suggested to keep this patient in Hospital for tonight and give some IV fluids.  PAST MEDICAL HISTORY:   Past Medical History:  Diagnosis Date  . A-fib (HCC)   . Acid indigestion   . Arthritis   . Complication of anesthesia    HARD TO WAKE UP  . GERD (gastroesophageal reflux disease)    RARE-NO MEDS  . History of kidney stones    H/O  . Hypertension   . Leaky heart valve    PT STATES HE HAD HEART CATH AND IT SHOWED ONE OF HIS VALVES DID NOT CLOSE PROPERLY-    PAST SURGICAL HISTORY:  Past Surgical History:  Procedure Laterality Date  . CARDIAC CATHETERIZATION    . CHOLECYSTECTOMY  04/29/14    SOCIAL HISTORY:  Social History   Tobacco Use  . Smoking status: Never Smoker  . Smokeless tobacco: Never Used  Substance Use Topics  . Alcohol use: No    Alcohol/week: 0.0 oz    FAMILY HISTORY:  Family History  Problem Relation Age of Onset  . Hypertension Mother     DRUG ALLERGIES: No Known Allergies  REVIEW OF SYSTEMS:   CONSTITUTIONAL: No  fever,positive for fatigue or weakness.  EYES: No blurred or double vision.  EARS, NOSE, AND THROAT: No tinnitus or ear pain.  RESPIRATORY: Have cough, shortness of breath, no wheezing or hemoptysis.  CARDIOVASCULAR: No chest pain, orthopnea, edema.  GASTROINTESTINAL: No nausea, vomiting, diarrhea or abdominal pain.  GENITOURINARY: No dysuria, hematuria.  ENDOCRINE: No polyuria, nocturia,  HEMATOLOGY: No anemia, easy bruising or bleeding SKIN: No rash or lesion. MUSCULOSKELETAL: No joint pain or arthritis.   NEUROLOGIC: No tingling, numbness, weakness.  PSYCHIATRY: No anxiety or depression.   MEDICATIONS AT HOME:  Prior to Admission medications   Medication Sig Start Date End Date Taking? Authorizing Provider  carvedilol (COREG) 25 MG tablet Take 12.5 mg by mouth 2 (two) times daily.  04/15/14   [provider]  meloxicam (MOBIC) 15 MG tablet Take 7.5 mg by mouth 2 (two) times daily.     [provider]  Multiple Vitamins-Minerals (HM MULTIVITAMIN ADULT GUMMY PO) Take 2 each by mouth daily.    [provider]  UNABLE TO FIND Take 1 capsule by mouth daily. Med Name: CBD CAPSULE    [provider]      PHYSICAL EXAMINATION:   VITAL SIGNS: Blood pressure 120/70, pulse 82, temperature (!) 103.3 F (39.6 C), temperature source  Oral, resp. rate 16, height 5\' 9"  (1.753 m), weight 104.3 kg (230 lb), SpO2 98 %.  GENERAL:  67 y.o.-year-old patient lying in the bed with no acute distress.  EYES: Pupils equal, round, reactive to light and accommodation. No scleral icterus. Extraocular muscles intact.  HEENT: Head atraumatic, normocephalic. Oropharynx and nasopharynx clear.  NECK:  Supple, no jugular venous distention. No thyroid enlargement, no tenderness.  LUNGS: Normal breath sounds bilaterally, no wheezing, have crepitation. No use of accessory muscles of respiration.  CARDIOVASCULAR: S1, S2 normal. No murmurs, rubs, or gallops.  ABDOMEN: Soft, nontender,  nondistended. Bowel sounds present. No organomegaly or mass.  EXTREMITIES: No pedal edema, cyanosis, or clubbing.  NEUROLOGIC: Cranial nerves II through XII are intact. Muscle strength 5/5 in all extremities. Sensation intact. Gait not checked.  PSYCHIATRIC: The patient is alert and oriented x 3.  SKIN: No obvious rash, lesion, or ulcer.   LABORATORY PANEL:   CBC Recent Labs  Lab 05/23/17 1347  WBC 8.3  HGB 13.4  HCT 39.4*  PLT 147*  MCV 88.5  MCH 30.0  MCHC 33.9  RDW 13.5  LYMPHSABS 0.5*  MONOABS 0.9  EOSABS 0.0  BASOSABS 0.0   ------------------------------------------------------------------------------------------------------------------  Chemistries  Recent Labs  Lab 05/23/17 1347  NA 136  K 3.6  CL 106  CO2 23  GLUCOSE 137*  BUN 17  CREATININE 1.26*  CALCIUM 8.5*  AST 31  ALT 28  ALKPHOS 71  BILITOT 1.1   ------------------------------------------------------------------------------------------------------------------ estimated creatinine clearance is 67.7 mL/min (A) (by C-G formula based on SCr of 1.26 mg/dL (H)). ------------------------------------------------------------------------------------------------------------------ No results for input(s): TSH, T4TOTAL, T3FREE, THYROIDAB in the last 72 hours.  Invalid input(s): FREET3   Coagulation profile No results for input(s): INR, PROTIME in the last 168 hours. ------------------------------------------------------------------------------------------------------------------- No results for input(s): DDIMER in the last 72 hours. -------------------------------------------------------------------------------------------------------------------  Cardiac Enzymes Recent Labs  Lab 05/23/17 1347  TROPONINI <0.03   ------------------------------------------------------------------------------------------------------------------ Invalid input(s):  POCBNP  ---------------------------------------------------------------------------------------------------------------  Urinalysis    Component Value Date/Time   COLORURINE AMBER (A) 05/23/2017 1824   APPEARANCEUR HAZY (A) 05/23/2017 1824   LABSPEC >1.046 (H) 05/23/2017 1824   PHURINE 5.0 05/23/2017 1824   GLUCOSEU NEGATIVE 05/23/2017 1824   HGBUR NEGATIVE 05/23/2017 1824   BILIRUBINUR NEGATIVE 05/23/2017 1824   KETONESUR NEGATIVE 05/23/2017 1824   PROTEINUR 30 (A) 05/23/2017 1824   NITRITE NEGATIVE 05/23/2017 1824   LEUKOCYTESUR NEGATIVE 05/23/2017 1824     RADIOLOGY: Ct Angio Head W Or Wo Contrast  Result Date: 05/23/2017 CLINICAL DATA:  67 y/o M; intermittent issues with dizziness and balance for 2-3 days. EXAM: CT ANGIOGRAPHY HEAD AND NECK TECHNIQUE: Multidetector CT imaging of the head and neck was performed using the standard protocol during bolus administration of intravenous contrast. Multiplanar CT image reconstructions and MIPs were obtained to evaluate the vascular anatomy. Carotid stenosis measurements (when applicable) are obtained utilizing NASCET criteria, using the distal internal carotid diameter as the denominator. CONTRAST:  75 cc Isovue 350. COMPARISON:  None. FINDINGS: CT HEAD FINDINGS Brain: No evidence of acute infarction, hemorrhage, hydrocephalus, extra-axial collection or mass lesion/mass effect. Several nonspecific foci of hypoattenuation in subcortical white matter are are compatible with moderate chronic microvascular ischemic changes for age. Mild brain parenchymal volume loss. Vascular: No hyperdense vessel or unexpected calcification. Skull: Normal. Negative for fracture or focal lesion. Sinuses: Mild ethmoid and left maxillary sinus mucosal thickening and small mucous retention cyst in the left maxillary sinus. Normal aeration of mastoid air cells. Orbits: No  acute finding. Review of the MIP images confirms the above findings CTA NECK FINDINGS Aortic arch:  Standard branching. Imaged portion shows no evidence of aneurysm or dissection. No significant stenosis of the major arch vessel origins. Mild calcific atherosclerosis. Right carotid system: No evidence of dissection, stenosis (50% or greater) or occlusion. Left carotid system: No evidence of dissection, stenosis (50% or greater) or occlusion. Vertebral arteries: Codominant. No evidence of dissection, stenosis (50% or greater) or occlusion. Skeleton: Cervical spondylosis with prominent discogenic degenerative changes greatest at the C6-7 level. Uncovertebral and facet hypertrophy results in right-sided bony foraminal stenosis at C5-C7 and left-sided stenosis at C6-7. Additionally, there is a large C6-7 disc osteophyte complex with approximately moderate canal stenosis. Other neck: 13 mm nodule in right lobe of thyroid. Upper chest: Negative. Review of the MIP images confirms the above findings CTA HEAD FINDINGS Anterior circulation: No significant stenosis, proximal occlusion, aneurysm, or vascular malformation. Posterior circulation: No significant stenosis, proximal occlusion, aneurysm, or vascular malformation. Venous sinuses: As permitted by contrast timing, patent. Anatomic variants: Complete circle-of-Willis. Delayed phase: No abnormal intracranial enhancement. Review of the MIP images confirms the above findings IMPRESSION: 1. No acute intracranial abnormality identified. No abnormal enhancement of the brain. 2. Patent carotid and vertebral arteries. No dissection, aneurysm, or hemodynamically significant stenosis utilizing NASCET criteria. 3. Patent anterior and posterior intracranial circulation. No large vessel occlusion, aneurysm, or significant stenosis. 4. Moderate chronic microvascular ischemic changes and mild parenchymal volume loss of the brain. 5. Cervical spondylosis greatest at C6-7 where there is approximately moderate canal stenosis and bilateral foraminal stenosis. Electronically Signed   By:  Mitzi Hansen M.D.   On: 05/23/2017 18:24   Dg Chest 2 View  Result Date: 05/23/2017 CLINICAL DATA:  Cough EXAM: CHEST - 2 VIEW COMPARISON:  None. FINDINGS: Normal heart size. Normal mediastinal contour. No pneumothorax. No pleural effusion. Mild patchy lingular opacity obscuring the left heart border. Curvilinear opacities at both lung bases. No pulmonary edema. No acute consolidative airspace disease. IMPRESSION: Mild patchy lingular opacity obscuring the left heart border, suggesting lingular pneumonia. Recommend follow-up PA and lateral post treatment chest radiographs in 4-6 weeks. Curvilinear bibasilar lung opacities compatible with mild scarring or atelectasis. Electronically Signed   By: Delbert Phenix M.D.   On: 05/23/2017 18:15   Ct Angio Neck W And/or Wo Contrast  Result Date: 05/23/2017 CLINICAL DATA:  67 y/o M; intermittent issues with dizziness and balance for 2-3 days. EXAM: CT ANGIOGRAPHY HEAD AND NECK TECHNIQUE: Multidetector CT imaging of the head and neck was performed using the standard protocol during bolus administration of intravenous contrast. Multiplanar CT image reconstructions and MIPs were obtained to evaluate the vascular anatomy. Carotid stenosis measurements (when applicable) are obtained utilizing NASCET criteria, using the distal internal carotid diameter as the denominator. CONTRAST:  75 cc Isovue 350. COMPARISON:  None. FINDINGS: CT HEAD FINDINGS Brain: No evidence of acute infarction, hemorrhage, hydrocephalus, extra-axial collection or mass lesion/mass effect. Several nonspecific foci of hypoattenuation in subcortical white matter are are compatible with moderate chronic microvascular ischemic changes for age. Mild brain parenchymal volume loss. Vascular: No hyperdense vessel or unexpected calcification. Skull: Normal. Negative for fracture or focal lesion. Sinuses: Mild ethmoid and left maxillary sinus mucosal thickening and small mucous retention cyst in the left  maxillary sinus. Normal aeration of mastoid air cells. Orbits: No acute finding. Review of the MIP images confirms the above findings CTA NECK FINDINGS Aortic arch: Standard branching. Imaged portion shows no evidence of aneurysm  or dissection. No significant stenosis of the major arch vessel origins. Mild calcific atherosclerosis. Right carotid system: No evidence of dissection, stenosis (50% or greater) or occlusion. Left carotid system: No evidence of dissection, stenosis (50% or greater) or occlusion. Vertebral arteries: Codominant. No evidence of dissection, stenosis (50% or greater) or occlusion. Skeleton: Cervical spondylosis with prominent discogenic degenerative changes greatest at the C6-7 level. Uncovertebral and facet hypertrophy results in right-sided bony foraminal stenosis at C5-C7 and left-sided stenosis at C6-7. Additionally, there is a large C6-7 disc osteophyte complex with approximately moderate canal stenosis. Other neck: 13 mm nodule in right lobe of thyroid. Upper chest: Negative. Review of the MIP images confirms the above findings CTA HEAD FINDINGS Anterior circulation: No significant stenosis, proximal occlusion, aneurysm, or vascular malformation. Posterior circulation: No significant stenosis, proximal occlusion, aneurysm, or vascular malformation. Venous sinuses: As permitted by contrast timing, patent. Anatomic variants: Complete circle-of-Willis. Delayed phase: No abnormal intracranial enhancement. Review of the MIP images confirms the above findings IMPRESSION: 1. No acute intracranial abnormality identified. No abnormal enhancement of the brain. 2. Patent carotid and vertebral arteries. No dissection, aneurysm, or hemodynamically significant stenosis utilizing NASCET criteria. 3. Patent anterior and posterior intracranial circulation. No large vessel occlusion, aneurysm, or significant stenosis. 4. Moderate chronic microvascular ischemic changes and mild parenchymal volume loss of  the brain. 5. Cervical spondylosis greatest at C6-7 where there is approximately moderate canal stenosis and bilateral foraminal stenosis. Electronically Signed   By: Mitzi HansenLance  Furusawa-Stratton M.D.   On: 05/23/2017 18:24    EKG: Orders placed or performed during the hospital encounter of 05/23/17  . ED EKG  . EKG 12-Lead  . EKG 12-Lead  . ED EKG    IMPRESSION AND PLAN:  * Influenza   Tamiflu oral total for 5 days.  * Pneumonia   Oral Levaquin.   Cultures are sent.  * Dizziness   Associated with dehydration and acute renal failure    No orthostatic drop in blood pressure, IV fluids and monitor.  * A. Fib   Will hold carvedilol because of dehydration and dizziness, keep on cardiac monitoring and continue anticoagulation.   All the records are reviewed and case discussed with ED provider. Management plans discussed with the patient, family and they are in agreement.  CODE STATUS: full.    TOTAL TIME TAKING CARE OF THIS PATIENT: 45 minutes.    Altamese DillingVaibhavkumar Makela Niehoff M.D on 05/23/2017   Between 7am to 6pm - Pager - [phone]  After 6pm go to [email or web address] - password [email or web address]  [facility]  Office  [phone]  CC: Primary care physician; Sherron Mondayejan-Sie, S Ahmed, MD   Note: This dictation was prepared with Dragon dictation along with smaller phrase technology. Any transcriptional errors that result from this process are unintentional.

## 2017-05-23 NOTE — ED Notes (Signed)
Stephen RN aware of bed assigned  

## 2017-05-23 NOTE — Consult Note (Signed)
ANTICOAGULATION CONSULT NOTE - Initial Consult  Pharmacy Consult for Xarelto (Rivaroxaban)  Indication: atrial fibrillation  Allergies  Allergen Reactions  . Pollen Extract    Patient Measurements: Height: 5\' 9"  (175.3 cm) Weight: 230 lb (104.3 kg) IBW/kg (Calculated) : 70.7  Vital Signs: Temp: 103.3 F (39.6 C) (04/02 1743) Temp Source: Oral (04/02 1743) BP: 120/70 (04/02 1334) Pulse Rate: 82 (04/02 1334)  Labs: Recent Labs    05/23/17 1347  HGB 13.4  HCT 39.4*  PLT 147*  CREATININE 1.26*  TROPONINI <0.03    Estimated Creatinine Clearance: 67.7 mL/min (A) (by C-G formula based on SCr of 1.26 mg/dL (H)).   Medical History: Past Medical History:  Diagnosis Date  . A-fib (HCC)   . Acid indigestion   . Arthritis   . Complication of anesthesia    HARD TO WAKE UP  . GERD (gastroesophageal reflux disease)    RARE-NO MEDS  . History of kidney stones    H/O  . Hypertension   . Leaky heart valve    PT STATES HE HAD HEART CATH AND IT SHOWED ONE OF HIS VALVES DID NOT CLOSE PROPERLY-   Assessment: Pharmacy consulted for Xarelto (Rivaroxaban) dosing in [age] male with PMH of A. Fib. Patient reports taking Rivaroxaban 20mg  PTA. Last dose reported 4/2 at around 1000.   Plan:  Rivaroxaban 20mg  every 24 hours ordered.  Gardner CandleSheema M Felicha Frayne, PharmD, BCPS Clinical Pharmacist 05/23/2017 7:45 PM

## 2017-05-23 NOTE — ED Notes (Signed)
Patient transported to CT 

## 2017-05-23 NOTE — ED Notes (Signed)
Pt c/o having dizziness or issues with balance intermittently for the past 2-3 days. States he does not have any sx with sitting or laying only with ambulation. Denies any pain or visual changes, denies any hx of vertigo. Denies any sinus congestion or ear ache..denies any recent falls or injury..Connor Stanley

## 2017-05-23 NOTE — ED Triage Notes (Signed)
Pt to ED from home c/o dizziness for a couple days, denies falls, LOC or hitting head.  States trouble sleeping, productive clear cough.  States took carvedilol, tylenol, and xeralto this morning around 1030.

## 2017-05-24 LAB — CBC
HCT: 36.3 % — ABNORMAL LOW (ref 40.0–52.0)
Hemoglobin: 12.3 g/dL — ABNORMAL LOW (ref 13.0–18.0)
MCH: 29.9 pg (ref 26.0–34.0)
MCHC: 33.9 g/dL (ref 32.0–36.0)
MCV: 88.4 fL (ref 80.0–100.0)
PLATELETS: 108 10*3/uL — AB (ref 150–440)
RBC: 4.1 MIL/uL — AB (ref 4.40–5.90)
RDW: 13.5 % (ref 11.5–14.5)
WBC: 5 10*3/uL (ref 3.8–10.6)

## 2017-05-24 LAB — BASIC METABOLIC PANEL
Anion gap: 6 (ref 5–15)
BUN: 14 mg/dL (ref 6–20)
CALCIUM: 7.8 mg/dL — AB (ref 8.9–10.3)
CHLORIDE: 109 mmol/L (ref 101–111)
CO2: 22 mmol/L (ref 22–32)
CREATININE: 1.15 mg/dL (ref 0.61–1.24)
GFR calc non Af Amer: >60 mL/min (ref >60)
Glucose, Bld: 104 mg/dL — ABNORMAL HIGH (ref 65–99)
Potassium: 3.5 mmol/L (ref 3.5–5.1)
SODIUM: 137 mmol/L (ref 135–145)

## 2017-05-24 MED ORDER — LEVOFLOXACIN 500 MG PO TABS: 500.0000 mg | ORAL_TABLET | Freq: Every day | ORAL | 0 refills | Status: DC

## 2017-05-24 MED ORDER — OSELTAMIVIR PHOSPHATE 75 MG PO CAPS: 75.0000 mg | ORAL_CAPSULE | Freq: Two times a day (BID) | ORAL | 0 refills | Status: AC

## 2017-05-24 NOTE — Discharge Summary (Signed)
[facility] [place] [facility]   PATIENT NAME: Connor Stanley    MR#:  [id number]  DATE OF BIRTH:  04-Dec-1950  DATE OF ADMISSION:  05/23/2017 ADMITTING PHYSICIAN: Altamese Dilling, MD  DATE OF DISCHARGE: 05/24/2017  PRIMARY CARE PHYSICIAN: Sherron Monday, MD    ADMISSION DIAGNOSIS:  Influenza A [J10.1] Gait instability [R26.81] Acute renal insufficiency [N28.9] Lingular pneumonia [J18.9]  DISCHARGE DIAGNOSIS:  Principal Problem:   Influenza A Active Problems:   Pneumonia   Acute renal failure (ARF) (HCC)   SECONDARY DIAGNOSIS:   Past Medical History:  Diagnosis Date  . A-fib (HCC)   . Acid indigestion   . Arthritis   . Complication of anesthesia    HARD TO WAKE UP  . GERD (gastroesophageal reflux disease)    RARE-NO MEDS  . History of kidney stones    H/O  . Hypertension   . Leaky heart valve    PT STATES HE HAD HEART CATH AND IT SHOWED ONE OF HIS VALVES DID NOT CLOSE PROPERLY-    HOSPITAL COURSE:   67 year old male with history of PAF who presents with dizziness.  1.  Influenza A: This is the etiology of patient's dizziness along with pneumonia Continue Tamiflu for 5 days.  2.  Community-acquired pneumonia: Patient will be discharged on oral Levaquin  3.  PAF: Patient will continue outpatient medications including Coreg, AMIODARONE and anticoagulation.    DISCHARGE CONDITIONS AND DIET:   Stable regular diet  CONSULTS OBTAINED:    DRUG ALLERGIES:   Allergies  Allergen Reactions  . Pollen Extract     DISCHARGE MEDICATIONS:   Allergies as of 05/24/2017      Reactions   Pollen Extract       Medication List    TAKE these medications   amiodarone 200 MG tablet Commonly known as:  PACERONE TAKE AS DIRECTED 4 TABS DAILY FOR 1 WEEK THEN 3 TABS DAILY FOR 1 WEEK, AND THEN 2 TAB DAILY   carvedilol 25 MG tablet Commonly known as:  COREG Take 25 mg by mouth 2 (two) times daily.   HM MULTIVITAMIN ADULT GUMMY PO Take 2  each by mouth daily.   levofloxacin 500 MG tablet Commonly known as:  LEVAQUIN Take 1 tablet (500 mg total) by mouth daily.   meloxicam 15 MG tablet Commonly known as:  MOBIC Take 7.5 mg by mouth 2 (two) times daily.   metaxalone 800 MG tablet Commonly known as:  SKELAXIN Take 1 tablet by mouth daily.   oseltamivir 75 MG capsule Commonly known as:  TAMIFLU Take 1 capsule (75 mg total) by mouth 2 (two) times daily for 4 days.   UNABLE TO FIND Take 1 capsule by mouth daily. Med Name: CBD CAPSULE   XARELTO 20 MG Tabs tablet Generic drug:  rivaroxaban Take 20 mg by mouth daily.         Today   CHIEF COMPLAINT:  Doing well walked without dizziness   VITAL SIGNS:  Blood pressure 132/69, pulse 81, temperature 98.8 F (37.1 C), temperature source Oral, resp. rate 18, height 5\' 9"  (1.753 m), weight 104.3 kg (230 lb), SpO2 93 %.   REVIEW OF SYSTEMS:  Review of Systems  Constitutional: Negative.  Negative for chills, fever and malaise/fatigue.  HENT: Negative.  Negative for ear discharge, ear pain, hearing loss, nosebleeds and sore throat.   Eyes: Negative.  Negative for blurred vision and pain.  Respiratory: Negative.  Negative for cough, hemoptysis, shortness of breath and  wheezing.   Cardiovascular: Negative.  Negative for chest pain, palpitations and leg swelling.  Gastrointestinal: Negative.  Negative for abdominal pain, blood in stool, diarrhea, nausea and vomiting.  Genitourinary: Negative.  Negative for dysuria.  Musculoskeletal: Negative.  Negative for back pain.  Skin: Negative.   Neurological: Negative for dizziness, tremors, speech change, focal weakness, seizures and headaches.  Endo/Heme/Allergies: Negative.  Does not bruise/bleed easily.  Psychiatric/Behavioral: Negative.  Negative for depression, hallucinations and suicidal ideas.     PHYSICAL EXAMINATION:  GENERAL:  67 y.o.-year-old patient lying in the bed with no acute distress.  NECK:  Supple, no  jugular venous distention. No thyroid enlargement, no tenderness.  LUNGS: Normal breath sounds bilaterally, no wheezing, rales,rhonchi  No use of accessory muscles of respiration.  CARDIOVASCULAR: S1, S2 normal. 2/6 SEM, rubs, or gallops.  ABDOMEN: Soft, non-tender, non-distended. Bowel sounds present. No organomegaly or mass.  EXTREMITIES: No pedal edema, cyanosis, or clubbing.  PSYCHIATRIC: The patient is alert and oriented x 3.  SKIN: No obvious rash, lesion, or ulcer.   DATA REVIEW:   CBC Recent Labs  Lab 05/24/17 0558  WBC 5.0  HGB 12.3*  HCT 36.3*  PLT 108*    Chemistries  Recent Labs  Lab 05/23/17 1347 05/24/17 0558  NA 136 137  K 3.6 3.5  CL 106 109  CO2 23 22  GLUCOSE 137* 104*  BUN 17 14  CREATININE 1.26* 1.15  CALCIUM 8.5* 7.8*  AST 31  --   ALT 28  --   ALKPHOS 71  --   BILITOT 1.1  --     Cardiac Enzymes Recent Labs  Lab 05/23/17 1347  TROPONINI <0.03    Microbiology Results  @[email or web address] @  RADIOLOGY:  Ct Angio Head W Or Wo Contrast  Result Date: 05/23/2017 CLINICAL DATA:  67 y/o M; intermittent issues with dizziness and balance for 2-3 days. EXAM: CT ANGIOGRAPHY HEAD AND NECK TECHNIQUE: Multidetector CT imaging of the head and neck was performed using the standard protocol during bolus administration of intravenous contrast. Multiplanar CT image reconstructions and MIPs were obtained to evaluate the vascular anatomy. Carotid stenosis measurements (when applicable) are obtained utilizing NASCET criteria, using the distal internal carotid diameter as the denominator. CONTRAST:  75 cc Isovue 350. COMPARISON:  None. FINDINGS: CT HEAD FINDINGS Brain: No evidence of acute infarction, hemorrhage, hydrocephalus, extra-axial collection or mass lesion/mass effect. Several nonspecific foci of hypoattenuation in subcortical white matter are are compatible with moderate chronic microvascular ischemic changes for age. Mild brain parenchymal volume loss. Vascular: No  hyperdense vessel or unexpected calcification. Skull: Normal. Negative for fracture or focal lesion. Sinuses: Mild ethmoid and left maxillary sinus mucosal thickening and small mucous retention cyst in the left maxillary sinus. Normal aeration of mastoid air cells. Orbits: No acute finding. Review of the MIP images confirms the above findings CTA NECK FINDINGS Aortic arch: Standard branching. Imaged portion shows no evidence of aneurysm or dissection. No significant stenosis of the major arch vessel origins. Mild calcific atherosclerosis. Right carotid system: No evidence of dissection, stenosis (50% or greater) or occlusion. Left carotid system: No evidence of dissection, stenosis (50% or greater) or occlusion. Vertebral arteries: Codominant. No evidence of dissection, stenosis (50% or greater) or occlusion. Skeleton: Cervical spondylosis with prominent discogenic degenerative changes greatest at the C6-7 level. Uncovertebral and facet hypertrophy results in right-sided bony foraminal stenosis at C5-C7 and left-sided stenosis at C6-7. Additionally, there is a large C6-7 disc osteophyte complex with approximately moderate canal stenosis. Other  neck: 13 mm nodule in right lobe of thyroid. Upper chest: Negative. Review of the MIP images confirms the above findings CTA HEAD FINDINGS Anterior circulation: No significant stenosis, proximal occlusion, aneurysm, or vascular malformation. Posterior circulation: No significant stenosis, proximal occlusion, aneurysm, or vascular malformation. Venous sinuses: As permitted by contrast timing, patent. Anatomic variants: Complete circle-of-Willis. Delayed phase: No abnormal intracranial enhancement. Review of the MIP images confirms the above findings IMPRESSION: 1. No acute intracranial abnormality identified. No abnormal enhancement of the brain. 2. Patent carotid and vertebral arteries. No dissection, aneurysm, or hemodynamically significant stenosis utilizing NASCET criteria.  3. Patent anterior and posterior intracranial circulation. No large vessel occlusion, aneurysm, or significant stenosis. 4. Moderate chronic microvascular ischemic changes and mild parenchymal volume loss of the brain. 5. Cervical spondylosis greatest at C6-7 where there is approximately moderate canal stenosis and bilateral foraminal stenosis. Electronically Signed   By: Mitzi Hansen M.D.   On: 05/23/2017 18:24   Dg Chest 2 View  Result Date: 05/23/2017 CLINICAL DATA:  Cough EXAM: CHEST - 2 VIEW COMPARISON:  None. FINDINGS: Normal heart size. Normal mediastinal contour. No pneumothorax. No pleural effusion. Mild patchy lingular opacity obscuring the left heart border. Curvilinear opacities at both lung bases. No pulmonary edema. No acute consolidative airspace disease. IMPRESSION: Mild patchy lingular opacity obscuring the left heart border, suggesting lingular pneumonia. Recommend follow-up PA and lateral post treatment chest radiographs in 4-6 weeks. Curvilinear bibasilar lung opacities compatible with mild scarring or atelectasis. Electronically Signed   By: Delbert Phenix M.D.   On: 05/23/2017 18:15   Ct Angio Neck W And/or Wo Contrast  Result Date: 05/23/2017 CLINICAL DATA:  67 y/o M; intermittent issues with dizziness and balance for 2-3 days. EXAM: CT ANGIOGRAPHY HEAD AND NECK TECHNIQUE: Multidetector CT imaging of the head and neck was performed using the standard protocol during bolus administration of intravenous contrast. Multiplanar CT image reconstructions and MIPs were obtained to evaluate the vascular anatomy. Carotid stenosis measurements (when applicable) are obtained utilizing NASCET criteria, using the distal internal carotid diameter as the denominator. CONTRAST:  75 cc Isovue 350. COMPARISON:  None. FINDINGS: CT HEAD FINDINGS Brain: No evidence of acute infarction, hemorrhage, hydrocephalus, extra-axial collection or mass lesion/mass effect. Several nonspecific foci of  hypoattenuation in subcortical white matter are are compatible with moderate chronic microvascular ischemic changes for age. Mild brain parenchymal volume loss. Vascular: No hyperdense vessel or unexpected calcification. Skull: Normal. Negative for fracture or focal lesion. Sinuses: Mild ethmoid and left maxillary sinus mucosal thickening and small mucous retention cyst in the left maxillary sinus. Normal aeration of mastoid air cells. Orbits: No acute finding. Review of the MIP images confirms the above findings CTA NECK FINDINGS Aortic arch: Standard branching. Imaged portion shows no evidence of aneurysm or dissection. No significant stenosis of the major arch vessel origins. Mild calcific atherosclerosis. Right carotid system: No evidence of dissection, stenosis (50% or greater) or occlusion. Left carotid system: No evidence of dissection, stenosis (50% or greater) or occlusion. Vertebral arteries: Codominant. No evidence of dissection, stenosis (50% or greater) or occlusion. Skeleton: Cervical spondylosis with prominent discogenic degenerative changes greatest at the C6-7 level. Uncovertebral and facet hypertrophy results in right-sided bony foraminal stenosis at C5-C7 and left-sided stenosis at C6-7. Additionally, there is a large C6-7 disc osteophyte complex with approximately moderate canal stenosis. Other neck: 13 mm nodule in right lobe of thyroid. Upper chest: Negative. Review of the MIP images confirms the above findings CTA HEAD FINDINGS Anterior circulation:  No significant stenosis, proximal occlusion, aneurysm, or vascular malformation. Posterior circulation: No significant stenosis, proximal occlusion, aneurysm, or vascular malformation. Venous sinuses: As permitted by contrast timing, patent. Anatomic variants: Complete circle-of-Willis. Delayed phase: No abnormal intracranial enhancement. Review of the MIP images confirms the above findings IMPRESSION: 1. No acute intracranial abnormality  identified. No abnormal enhancement of the brain. 2. Patent carotid and vertebral arteries. No dissection, aneurysm, or hemodynamically significant stenosis utilizing NASCET criteria. 3. Patent anterior and posterior intracranial circulation. No large vessel occlusion, aneurysm, or significant stenosis. 4. Moderate chronic microvascular ischemic changes and mild parenchymal volume loss of the brain. 5. Cervical spondylosis greatest at C6-7 where there is approximately moderate canal stenosis and bilateral foraminal stenosis. Electronically Signed   By: Mitzi HansenLance  Furusawa-Stratton M.D.   On: 05/23/2017 18:24      Allergies as of 05/24/2017      Reactions   Pollen Extract       Medication List    TAKE these medications   amiodarone 200 MG tablet Commonly known as:  PACERONE TAKE AS DIRECTED 4 TABS DAILY FOR 1 WEEK THEN 3 TABS DAILY FOR 1 WEEK, AND THEN 2 TAB DAILY   carvedilol 25 MG tablet Commonly known as:  COREG Take 25 mg by mouth 2 (two) times daily.   HM MULTIVITAMIN ADULT GUMMY PO Take 2 each by mouth daily.   levofloxacin 500 MG tablet Commonly known as:  LEVAQUIN Take 1 tablet (500 mg total) by mouth daily.   meloxicam 15 MG tablet Commonly known as:  MOBIC Take 7.5 mg by mouth 2 (two) times daily.   metaxalone 800 MG tablet Commonly known as:  SKELAXIN Take 1 tablet by mouth daily.   oseltamivir 75 MG capsule Commonly known as:  TAMIFLU Take 1 capsule (75 mg total) by mouth 2 (two) times daily for 4 days.   UNABLE TO FIND Take 1 capsule by mouth daily. Med Name: CBD CAPSULE   XARELTO 20 MG Tabs tablet Generic drug:  rivaroxaban Take 20 mg by mouth daily.         Management plans discussed with the patient and he is in agreement. Stable for discharge home  Patient should follow up with pcp  CODE STATUS:     Code Status Orders  (From admission, onward)        Start     Ordered   05/23/17 2023  Full code  Continuous     05/23/17 2022    Code Status  History    This patient has a current code status but no historical code status.      TOTAL TIME TAKING CARE OF THIS PATIENT: 38 minutes.    Note: This dictation was prepared with Dragon dictation along with smaller phrase technology. Any transcriptional errors that result from this process are unintentional.  Jamaar Howes M.D on 05/24/2017 at 10:46 AM  Between 7am to 6pm - Pager - [phone] After 6pm go to [email or web address] - password [email or web address]  [facility]  Office  [phone]  CC: Primary care physician; Sherron Mondayejan-Sie, S Ahmed, MD

## 2017-05-24 NOTE — Plan of Care (Signed)
MD making rounds. Order received to discharge home. IV removed. Prescriptions given to patient and family. Discharge paperwork provided, explained, signed and witnessed. No unanswered questions. Discharged via wheelchair with auxiliary staff. Belongings sent with patient and family.

## 2017-05-24 NOTE — Care Management Note (Signed)
Case Management Note  Patient Details  Name: Connor BrunsVincent Brandel MRN: [id number] Date of Birth: 06/10/1950  Subjective/Objective:   Admitted to [facility] under observation status with the diagnosis of flu. Lives with wife, Enid DerryCarole [phone]). Last seen Dr. Kinnie Feilejansie 2 months ago. Prescriptions are filled at [facility] [address] No home Health. No skilled facility. No medical equipment in the home. Takes care of all basic activities of daily living himself, drives. No falls. Great appetite. Family will transport                  Action/Plan: No follow-up needs identified at this time  Expected Discharge Date:                  Expected Discharge Plan:     In-House Referral:     Discharge planning Services     Post Acute Care Choice:    Choice offered to:     DME Arranged:    DME Agency:     HH Arranged:    HH Agency:     Status of Service:     If discussed at [employer] Meetings, dates discussed:    Additional Comments:  Gwenette GreetBrenda S Shaana Acocella, RN MSN CCM Care Management [phone] 05/24/2017, 9:00 AM

## 2017-05-24 NOTE — Plan of Care (Signed)
Pt admitted  + for flu Type A and CAP.  D/ced home on levaquin and tamiflu.  Pt is A&O, independent. From home with his wife and 3 children.  No c/o pain.  No problems breathing.  IV removed by Maddy, RN.  Will review d/c instructions and f/u appts.  Pt will transport home with family.

## 2017-05-24 NOTE — Care Management Obs Status (Signed)
MEDICARE OBSERVATION STATUS NOTIFICATION   Patient Details  Name: Connor Stanley MRN: [id number] Date of Birth: 10/10/1950   Medicare Observation Status Notification Given:  Yes. Given permission per Mr Carley Hammeditti to x document    Gwenette GreetBrenda S Penelope Fittro, RN 05/24/2017, 9:06 AM

## 2017-05-28 LAB — CULTURE, BLOOD (ROUTINE X 2)
Culture: NO GROWTH
Culture: NO GROWTH
Special Requests: ADEQUATE

## 2019-08-21 ENCOUNTER — Encounter: Payer: Self-pay | Admitting: *Deleted

## 2019-08-21 ENCOUNTER — Other Ambulatory Visit: Payer: Self-pay

## 2019-08-21 ENCOUNTER — Encounter: Payer: BC Managed Care – PPO | Attending: Internal Medicine | Admitting: *Deleted

## 2019-08-21 VITALS — BP 118/82 | Ht 69.0 in | Wt 250.7 lb

## 2019-08-21 DIAGNOSIS — E119 Type 2 diabetes mellitus without complications: Secondary | ICD-10-CM | POA: Insufficient documentation

## 2019-08-21 NOTE — Patient Instructions (Addendum)
Check blood sugars 1-2 x day before breakfast and/or 2 hrs after one meal - 3 x week Bring blood sugar records to the next class  Call your doctor for a prescription for:  1. Meter strips (type) Contour Next  checking  3 times per week  2. Lancets (type) Contour Microlet checking  3     times per week  Exercise: Begin walking for 10-15  minutes  3 days a week and gradually increase  Eat 3 meals day,  1-2 snacks a day Space meals 4-6 hours apart Don't skip meals Avoid sugar sweetened drinks (coffee, juices) Limit desserts/sweets  Return for classes on:

## 2019-08-21 NOTE — Progress Notes (Signed)
Diabetes Self-Management Education  Visit Type: First/Initial  Appt. Start Time: 1050 Appt. End Time: 1150  08/21/2019  Connor Stanley, identified by name and date of birth, is a 69 y.o. male with a diagnosis of Diabetes: Type 2.   ASSESSMENT  Blood pressure 118/82, height 5\' 9"  (1.753 m), weight 250 lb 11.2 oz (113.7 kg). Body mass index is 37.02 kg/m.   Diabetes Self-Management Education - 08/21/19 1221      Visit Information   Visit Type First/Initial      Initial Visit   Diabetes Type Type 2    Are you currently following a meal plan? Yes    What type of meal plan do you follow? "eating more fish and chicken - like Mediterranean diet"    Are you taking your medications as prescribed? Yes    Date Diagnosed 1 month      Health Coping   How would you rate your overall health? Good      Psychosocial Assessment   Patient Belief/Attitude about Diabetes Motivated to manage diabetes    Self-care barriers None    Self-management support Doctor's office;Family    Patient Concerns Nutrition/Meal planning;Medication;Glycemic Control;Monitoring;Weight Control    Special Needs None    Preferred Learning Style Visual;Auditory    Learning Readiness Change in progress    How often do you need to have someone help you when you read instructions, pamphlets, or other written materials from your doctor or pharmacy? 1 - Never    What is the last grade level you completed in school? college      Pre-Education Assessment   Patient understands the diabetes disease and treatment process. Needs Instruction    Patient understands incorporating nutritional management into lifestyle. Needs Instruction    Patient undertands incorporating physical activity into lifestyle. Needs Instruction    Patient understands using medications safely. Needs Instruction    Patient understands monitoring blood glucose, interpreting and using results Needs Instruction    Patient understands prevention,  detection, and treatment of acute complications. Needs Instruction    Patient understands prevention, detection, and treatment of chronic complications. Needs Instruction    Patient understands how to develop strategies to address psychosocial issues. Needs Instruction    Patient understands how to develop strategies to promote health/change behavior. Needs Instruction      Complications   Last HgB A1C per patient/outside source 7.2 %   07/17/2019   How often do you check your blood sugar? 0 times/day (not testing)   Provided Contour Next EZ meter and instructed on use. BG upon return demonstration was 141 mg/dL at 07/19/2019 am - 3 1/2 hrs pp.   Have you had a dilated eye exam in the past 12 months? Yes    Have you had a dental exam in the past 12 months? Yes    Are you checking your feet? Yes    How many days per week are you checking your feet? 1      Dietary Intake   Breakfast skips or eats banana with coffee    Snack (morning) 0-1 snacks/day - nuts and dried fruit    Lunch has small lunch and large supper or large lunch and small supper - meat sandwich, fruit - orange    Dinner chicken, fish, pork with potatoes, peas benas, corn, rice pasta, green beans, broccoli, tomatoes, asparagus, brussels sprouts    Snack (evening) eats 6-7 servings of desserts/sweets per week    Beverage(s) water, coffee with sugar, juice  Exercise   Exercise Type ADL's      Patient Education   Previous Diabetes Education No    Disease state  Definition of diabetes, type 1 and 2, and the diagnosis of diabetes;Factors that contribute to the development of diabetes    Nutrition management  Role of diet in the treatment of diabetes and the relationship between the three main macronutrients and blood glucose level;Food label reading, portion sizes and measuring food.;Reviewed blood glucose goals for pre and post meals and how to evaluate the patients' food intake on their blood glucose level.    Physical activity and  exercise  Role of exercise on diabetes management, blood pressure control and cardiac health.    Medications Reviewed patients medication for diabetes, action, purpose, timing of dose and side effects.    Monitoring Taught/evaluated SMBG meter.;Purpose and frequency of SMBG.;Taught/discussed recording of test results and interpretation of SMBG.;Identified appropriate SMBG and/or A1C goals.    Chronic complications Relationship between chronic complications and blood glucose control    Psychosocial adjustment Identified and addressed patients feelings and concerns about diabetes      Individualized Goals (developed by patient)   Reducing Risk Other (comment)   improve blood sugars, decrease medications, prevent diabetes complications, lose weight, become more fit     Outcomes   Expected Outcomes Demonstrated interest in learning. Expect positive outcomes    Future DMSE 4-6 wks           Individualized Plan for Diabetes Self-Management Training:   Learning Objective:  Patient will have a greater understanding of diabetes self-management. Patient education plan is to attend individual and/or group sessions per assessed needs and concerns.   Plan:   Patient Instructions  Check blood sugars 1-2 x day before breakfast and/or 2 hrs after one meal - 3 x week Bring blood sugar records to the next class Call your doctor for a prescription for:  1. Meter strips (type) Contour Next  checking  3 times per week  2. Lancets (type) Contour Microlet checking  3     times per week Exercise: Begin walking for 10-15  minutes  3 days a week and gradually increase Eat 3 meals day,  1-2 snacks a day Space meals 4-6 hours apart Don't skip meals Avoid sugar sweetened drinks (coffee, juices) Limit desserts/sweets  Expected Outcomes:  Demonstrated interest in learning. Expect positive outcomes  Education material provided:  General Meal Planning Guidelines Simple Meal Plan Meter = Contour Next  EZ  If problems or questions, patient to contact team via:  Sharion Settler, RN, CCM, CDCES [phone]  Future DSME appointment: 4-6 wks  September 19, 2019 for Diabetes Class 1

## 2019-09-19 ENCOUNTER — Ambulatory Visit: Payer: BC Managed Care – PPO

## 2019-09-26 ENCOUNTER — Ambulatory Visit: Payer: BC Managed Care – PPO

## 2019-10-03 ENCOUNTER — Ambulatory Visit: Payer: BC Managed Care – PPO

## 2019-10-23 ENCOUNTER — Encounter: Payer: Self-pay | Admitting: *Deleted

## 2020-08-07 ENCOUNTER — Encounter: Payer: Self-pay | Admitting: Emergency Medicine

## 2020-08-07 ENCOUNTER — Emergency Department: Payer: BC Managed Care – PPO

## 2020-08-07 DIAGNOSIS — I10 Essential (primary) hypertension: Secondary | ICD-10-CM | POA: Insufficient documentation

## 2020-08-07 DIAGNOSIS — Z7984 Long term (current) use of oral hypoglycemic drugs: Secondary | ICD-10-CM | POA: Insufficient documentation

## 2020-08-07 DIAGNOSIS — Z79899 Other long term (current) drug therapy: Secondary | ICD-10-CM | POA: Insufficient documentation

## 2020-08-07 DIAGNOSIS — E119 Type 2 diabetes mellitus without complications: Secondary | ICD-10-CM | POA: Diagnosis not present

## 2020-08-07 DIAGNOSIS — Z7901 Long term (current) use of anticoagulants: Secondary | ICD-10-CM | POA: Insufficient documentation

## 2020-08-07 DIAGNOSIS — R0789 Other chest pain: Secondary | ICD-10-CM | POA: Insufficient documentation

## 2020-08-07 DIAGNOSIS — I4891 Unspecified atrial fibrillation: Secondary | ICD-10-CM | POA: Insufficient documentation

## 2020-08-07 LAB — BASIC METABOLIC PANEL
Anion gap: 4 — ABNORMAL LOW (ref 5–15)
BUN: 14 mg/dL (ref 8–23)
CO2: 24 mmol/L (ref 22–32)
Calcium: 8.5 mg/dL — ABNORMAL LOW (ref 8.9–10.3)
Chloride: 107 mmol/L (ref 98–111)
Creatinine, Ser: 1.11 mg/dL (ref 0.61–1.24)
GFR, Estimated: >60 mL/min (ref >60)
Glucose, Bld: 141 mg/dL — ABNORMAL HIGH (ref 70–99)
Potassium: 3.6 mmol/L (ref 3.5–5.1)
Sodium: 135 mmol/L (ref 135–145)

## 2020-08-07 LAB — CBC
HCT: 40.1 % (ref 39.0–52.0)
Hemoglobin: 14.1 g/dL (ref 13.0–17.0)
MCH: 30.9 pg (ref 26.0–34.0)
MCHC: 35.2 g/dL (ref 30.0–36.0)
MCV: 87.7 fL (ref 80.0–100.0)
Platelets: 175 10*3/uL (ref 150–400)
RBC: 4.57 MIL/uL (ref 4.22–5.81)
RDW: 12.9 % (ref 11.5–15.5)
WBC: 9.5 10*3/uL (ref 4.0–10.5)
nRBC: 0 % (ref 0.0–0.2)

## 2020-08-07 LAB — TROPONIN I (HIGH SENSITIVITY): Troponin I (High Sensitivity): 6 ng/L (ref <18)

## 2020-08-07 NOTE — ED Triage Notes (Signed)
Pt c/o intermittent left sided chest pain x2 days, worse today. Pt denies SOB, N/V.

## 2020-08-08 ENCOUNTER — Emergency Department
Admission: EM | Admit: 2020-08-08 | Discharge: 2020-08-08 | Disposition: A | Discharge status: Home / Self Care | Payer: BC Managed Care – PPO | Attending: Emergency Medicine | Admitting: Emergency Medicine

## 2020-08-08 DIAGNOSIS — R0789 Other chest pain: Secondary | ICD-10-CM

## 2020-08-08 NOTE — ED Provider Notes (Signed)
[facility] Emergency Department Provider Note   ____________________________________________   Event Date/Time   First MD Initiated Contact with Patient 08/08/20 0034     (approximate)  I have reviewed the triage vital signs and the nursing notes.   HISTORY  Chief Complaint Chest Pain    HPI Calvin Jablonowski is a 70 y.o. male with below stated past medical history presents for left-sided intermittent chest pain over the last 1.5 weeks.  Patient describes an aching, soreness that is 5/10 at the most severe intensity and does not radiate.  Patient denies any exacerbating or relieving factors for this pain.  Patient denies any associated shortness of breath or dyspnea on exertion.  Patient denies any pain similar to this in the past.  Patient states that he had a cardiac catheterization approximately 7 years ago and was told that his vessels were clean however he did have some valve regurgitation but does not member which what it is.  Patient currently denies any vision changes, tinnitus, difficulty speaking, facial droop, sore throat, shortness of breath, abdominal pain, nausea/vomiting/diarrhea, dysuria, or weakness/numbness/paresthesias in any extremity         Past Medical History:  Diagnosis Date   A-fib (HCC)    Acid indigestion    Arthritis    Complication of anesthesia    HARD TO WAKE UP   Diabetes mellitus without complication (HCC)    GERD (gastroesophageal reflux disease)    RARE-NO MEDS   History of kidney stones    H/O   Hypertension    Leaky heart valve    PT STATES HE HAD HEART CATH AND IT SHOWED ONE OF HIS VALVES DID NOT CLOSE PROPERLY-    Patient Active Problem List   Diagnosis Date Noted   Influenza A 05/23/2017   Pneumonia 05/23/2017   Acute renal failure (ARF) (HCC) 05/23/2017    Past Surgical History:  Procedure Laterality Date   CARDIAC CATHETERIZATION     CHOLECYSTECTOMY  04/29/14    Prior to Admission medications    Medication Sig Start Date End Date Taking? Authorizing Provider  carvedilol (COREG) 25 MG tablet Take 25 mg by mouth daily.    [provider]  GLUCOSAMINE-CHONDROITIN PO Take 1-2 tablets by mouth daily.    [provider]  metFORMIN (GLUCOPHAGE) 500 MG tablet Take 500 mg by mouth 2 (two) times daily. 07/31/19   [provider]  Multiple Vitamins-Minerals (HM MULTIVITAMIN ADULT GUMMY PO) Take 2 each by mouth daily.    [provider]  XARELTO 20 MG TABS tablet Take 20 mg by mouth daily. 05/19/17   [provider]    Allergies Pollen extract  Family History  Problem Relation Age of Onset   Hypertension Mother     Social History Social History   Tobacco Use   Smoking status: Never   Smokeless tobacco: Never  Vaping Use   Vaping Use: Never used  Substance Use Topics   Alcohol use: No    Alcohol/week: 0.0 standard drinks   Drug use: No    Review of Systems Constitutional: No fever/chills Eyes: No visual changes. ENT: No sore throat. Cardiovascular: Endorses chest pain. Respiratory: Denies shortness of breath. Gastrointestinal: No abdominal pain.  No nausea, no vomiting.  No diarrhea. Genitourinary: Negative for dysuria. Musculoskeletal: Negative for acute arthralgias Skin: Negative for rash. Neurological: Negative for headaches, weakness/numbness/paresthesias in any extremity Psychiatric: Negative for suicidal ideation/homicidal ideation   ____________________________________________   PHYSICAL EXAM:  VITAL SIGNS: ED Triage  Vitals  Enc Vitals Group     BP 08/07/20 2036 (!) 190/93     Pulse Rate 08/07/20 2036 70     Resp 08/07/20 2036 18     Temp 08/07/20 2036 99.4 F (37.4 C)     Temp Source 08/07/20 2036 Oral     SpO2 08/07/20 2036 97 %     Weight 08/07/20 2034 250 lb (113.4 kg)     Height --      Head Circumference --      Peak Flow --      Pain Score --      Pain Loc --      Pain Edu? --      Excl. in GC? --     Constitutional: Alert and oriented. Well appearing and in no acute distress. Eyes: Conjunctivae are normal. PERRL. Head: Atraumatic. Nose: No congestion/rhinnorhea. Mouth/Throat: Mucous membranes are moist. Neck: No stridor Cardiovascular: Grossly normal heart sounds.  Good peripheral circulation. Respiratory: Normal respiratory effort.  No retractions. Gastrointestinal: Soft and nontender. No distention. Musculoskeletal: No obvious deformities Neurologic:  Normal speech and language. No gross focal neurologic deficits are appreciated. Skin:  Skin is warm and dry. No rash noted. Psychiatric: Mood and affect are normal. Speech and behavior are normal.  ____________________________________________   LABS (all labs ordered are listed, but only abnormal results are displayed)  Labs Reviewed  BASIC METABOLIC PANEL - Abnormal; Notable for the following components:      Result Value   Glucose, Bld 141 (*)    Calcium 8.5 (*)    Anion gap 4 (*)    All other components within normal limits  CBC  TROPONIN I (HIGH SENSITIVITY)  TROPONIN I (HIGH SENSITIVITY)   ____________________________________________  EKG  ED ECG REPORT I, Merwyn Katos, the attending physician, personally viewed and interpreted this ECG.  Date: 08/07/2020 EKG Time: 2033 Rate: 68 Rhythm: normal sinus rhythm QRS Axis: normal Intervals: normal ST/T Wave abnormalities: normal Narrative Interpretation: no evidence of acute ischemia  ____________________________________________  RADIOLOGY  ED MD interpretation: 2 view x-ray of the chest shows left lingular scarring without any evidence of acute abnormalities  Official radiology report(s): DG Chest 2 View  Result Date: 08/07/2020 CLINICAL DATA:  Chest pain for 2 days EXAM: CHEST - 2 VIEW COMPARISON:  05/23/2017 FINDINGS: Cardiac shadow is stable. Scarring is noted in the lingula, stable from the prior study. No focal infiltrate or sizable effusion is  noted. No bony abnormality is seen. IMPRESSION: Left lingular scarring.  No acute abnormality noted. Electronically Signed   By: Alcide Clever M.D.   On: 08/07/2020 21:00    ____________________________________________   PROCEDURES  Procedure(s) performed (including Critical Care):  .1-3 Lead EKG Interpretation  Date/Time: 08/08/2020 1:33 AM Performed by: Merwyn Katos, MD Authorized by: Merwyn Katos, MD     Interpretation: normal     ECG rate:  62   ECG rate assessment: normal     Rhythm: sinus rhythm     Ectopy: none     Conduction: normal     ____________________________________________   INITIAL IMPRESSION / ASSESSMENT AND PLAN / ED COURSE  As part of my medical decision making, I reviewed the following data within the electronic MEDICAL RECORD NUMBER Nursing notes reviewed and incorporated, Labs reviewed, EKG interpreted, Old chart reviewed, Radiograph reviewed and Notes from prior ED visits reviewed and incorporated      70 year old male with the above-stated past medical history presents for intermittent left-sided  chest pain Workup: ECG, CXR, CBC, BMP, Troponin Findings: ECG: No overt evidence of STEMI. No evidence of Brugadas sign, delta wave, epsilon wave, significantly prolonged QTc, or malignant arrhythmia HS Troponin: Negative x1 Other Labs unremarkable for emergent problems. CXR: Without PTX, PNA, or widened mediastinum Last Stress Test:  2015 Last Heart Catheterization:  2015 HEART Score: 4  Given History, Exam, and Workup I have low suspicion for ACS, Pneumothorax, Pneumonia, Pulmonary Embolus, Tamponade, Aortic Dissection or other emergent problem as a cause for this presentation.   Reassesment: Prior to discharge patients pain was controlled and they were well appearing.  Disposition:  Discharge. Strict return precautions discussed with patient with full understanding. Advised patient to follow up promptly with primary care provider        ____________________________________________   FINAL CLINICAL IMPRESSION(S) / ED DIAGNOSES  Final diagnoses:  Intermittent left-sided chest pain     ED Discharge Orders     None        Note:  This document was prepared using Dragon voice recognition software and may include unintentional dictation errors.    Merwyn Katos, MD 08/08/20 [phone]

## 2020-08-08 NOTE — Discharge Instructions (Addendum)
Please use ibuprofen/naproxen for any continued left-sided chest pain

## 2020-11-05 ENCOUNTER — Ambulatory Visit (INDEPENDENT_AMBULATORY_CARE_PROVIDER_SITE_OTHER): Payer: BC Managed Care – PPO

## 2020-11-05 ENCOUNTER — Ambulatory Visit
Admission: EM | Admit: 2020-11-05 | Discharge: 2020-11-05 | Disposition: A | Discharge status: Home / Self Care | Payer: BC Managed Care – PPO | Attending: Emergency Medicine | Admitting: Emergency Medicine

## 2020-11-05 ENCOUNTER — Other Ambulatory Visit: Payer: Self-pay

## 2020-11-05 DIAGNOSIS — I10 Essential (primary) hypertension: Secondary | ICD-10-CM | POA: Diagnosis not present

## 2020-11-05 DIAGNOSIS — M79662 Pain in left lower leg: Secondary | ICD-10-CM | POA: Diagnosis not present

## 2020-11-05 DIAGNOSIS — M79661 Pain in right lower leg: Secondary | ICD-10-CM | POA: Diagnosis not present

## 2020-11-05 NOTE — Discharge Instructions (Addendum)
Your x-ray is normal.  Take Tylenol as needed for discomfort.  Rest and elevate your lower leg.  Follow-up with your primary care provider or an orthopedist if your symptoms or not improving.  Your blood pressure is elevated today at 196/113; repeat 165/107; repeat 167/103.  Please have this rechecked by your primary care provider in 1-2 weeks.

## 2020-11-05 NOTE — ED Triage Notes (Signed)
Pt states hit his rt lower leg on a piece of furniture on Saturday. C/o redness, swelling, and pain.

## 2020-11-05 NOTE — ED Provider Notes (Signed)
Renaldo Fiddler    CSN: [id number] Arrival date & time: 11/05/20  0911      History   Chief Complaint Chief Complaint  Patient presents with   Leg Pain    HPI Connor Stanley is a 70 y.o. male.  Patient presents with right lower leg pain after he hit his leg on a piece of furniture 4 days ago.  He states the pain has not improved.  He denies numbness, weakness, open wounds, or other symptoms.  Treatment attempted at home with Tylenol.  His medical history includes hypertension, atrial fibrillation, diabetes, GERD, kidney stones, arthritis.  The history is provided by the patient and medical records.   Past Medical History:  Diagnosis Date   A-fib (HCC)    Acid indigestion    Arthritis    Complication of anesthesia    HARD TO WAKE UP   Diabetes mellitus without complication (HCC)    GERD (gastroesophageal reflux disease)    RARE-NO MEDS   History of kidney stones    H/O   Hypertension    Leaky heart valve    PT STATES HE HAD HEART CATH AND IT SHOWED ONE OF HIS VALVES DID NOT CLOSE PROPERLY-    Patient Active Problem List   Diagnosis Date Noted   Influenza A 05/23/2017   Pneumonia 05/23/2017   Acute renal failure (ARF) (HCC) 05/23/2017    Past Surgical History:  Procedure Laterality Date   CARDIAC CATHETERIZATION     CHOLECYSTECTOMY  04/29/14       Home Medications    Prior to Admission medications   Medication Sig Start Date End Date Taking? Authorizing Provider  carvedilol (COREG) 25 MG tablet Take 25 mg by mouth daily.    [provider]  GLUCOSAMINE-CHONDROITIN PO Take 1-2 tablets by mouth daily.    [provider]  metFORMIN (GLUCOPHAGE) 500 MG tablet Take 500 mg by mouth 2 (two) times daily. 07/31/19   [provider]  Multiple Vitamins-Minerals (HM MULTIVITAMIN ADULT GUMMY PO) Take 2 each by mouth daily.    [provider]  XARELTO 20 MG TABS tablet Take 20 mg by mouth daily. 05/19/17   [provider]     Family History Family History  Problem Relation Age of Onset   Hypertension Mother     Social History Social History   Tobacco Use   Smoking status: Never   Smokeless tobacco: Never  Vaping Use   Vaping Use: Never used  Substance Use Topics   Alcohol use: No    Alcohol/week: 0.0 standard drinks   Drug use: No     Allergies   Pollen extract   Review of Systems Review of Systems  Constitutional:  Negative for chills and fever.  Respiratory:  Negative for cough and shortness of breath.   Cardiovascular:  Negative for chest pain and palpitations.  Musculoskeletal:  Positive for arthralgias. Negative for back pain and gait problem.  Skin:  Positive for color change. Negative for rash.  Neurological:  Negative for weakness and numbness.  All other systems reviewed and are negative.   Physical Exam Triage Vital Signs ED Triage Vitals [11/05/20 0925]  Enc Vitals Group     BP (!) 196/113     Pulse Rate 76     Resp 18     Temp 98.3 F (36.8 C)     Temp Source Oral     SpO2 95 %     Weight      Height  Head Circumference      Peak Flow      Pain Score      Pain Loc      Pain Edu?      Excl. in GC?    No data found.  Updated Vital Signs BP (!) 167/103 (BP Location: Left Arm)   Pulse 76   Temp 98.3 F (36.8 C) (Oral)   Resp 18   SpO2 95%   Visual Acuity Right Eye Distance:   Left Eye Distance:   Bilateral Distance:    Right Eye Near:   Left Eye Near:    Bilateral Near:     Physical Exam Vitals and nursing note reviewed.  Constitutional:      General: He is not in acute distress.    Appearance: He is well-developed. He is not ill-appearing.  HENT:     Head: Normocephalic and atraumatic.     Mouth/Throat:     Mouth: Mucous membranes are moist.  Eyes:     Conjunctiva/sclera: Conjunctivae normal.  Cardiovascular:     Rate and Rhythm: Normal rate and regular rhythm.     Heart sounds: Normal heart sounds.  Pulmonary:     Effort:  Pulmonary effort is normal. No respiratory distress.     Breath sounds: Normal breath sounds.  Abdominal:     Palpations: Abdomen is soft.     Tenderness: There is no abdominal tenderness.  Musculoskeletal:        General: Tenderness present. No swelling or deformity. Normal range of motion.     Cervical back: Neck supple.       Legs:  Skin:    General: Skin is warm and dry.     Findings: Erythema present. No bruising, lesion or rash.  Neurological:     General: No focal deficit present.     Mental Status: He is alert and oriented to person, place, and time.     Sensory: No sensory deficit.     Motor: No weakness.     Gait: Gait normal.  Psychiatric:        Mood and Affect: Mood normal.        Behavior: Behavior normal.     UC Treatments / Results  Labs (all labs ordered are listed, but only abnormal results are displayed) Labs Reviewed - No data to display  EKG   Radiology DG Tibia/Fibula Left  Result Date: 11/05/2020 CLINICAL DATA:  Left lower leg pain after injury several days ago. EXAM: LEFT TIBIA AND FIBULA - 2 VIEW COMPARISON:  None. FINDINGS: There is no evidence of fracture or other focal bone lesions. Soft tissues are unremarkable. IMPRESSION: Negative. Electronically Signed   By: Lupita Raider M.D.   On: 11/05/2020 10:50    Procedures Procedures (including critical care time)  Medications Ordered in UC Medications - No data to display  Initial Impression / Assessment and Plan / UC Course  I have reviewed the triage vital signs and the nursing notes.  Pertinent labs & imaging results that were available during my care of the patient were reviewed by me and considered in my medical decision making (see chart for details).  Right lower leg pain due to injury.  Elevated blood pressure reading with hypertension.  X-ray negative.  Discussed symptomatic treatment including Tylenol, rest, elevation.  Instructed patient to follow-up with his PCP or an orthopedist  if his symptoms are not improving.  Also discussed that his blood pressure is elevated today and needs to be  rechecked by his PCP in 1 to 2 weeks.  He states he has not taken his blood pressure medication yet today.  Instructed him to go home and take his blood pressure medicine when he leaves here.  Education provided on managing hypertension.  He agrees to plan of care.   Final Clinical Impressions(s) / UC Diagnoses   Final diagnoses:  Pain of right lower leg  Elevated blood pressure reading in office with diagnosis of hypertension     Discharge Instructions      Your x-ray is normal.  Take Tylenol as needed for discomfort.  Rest and elevate your lower leg.  Follow-up with your primary care provider or an orthopedist if your symptoms or not improving.  Your blood pressure is elevated today at 196/113; repeat 165/107; repeat 167/103.  Please have this rechecked by your primary care provider in 1-2 weeks.          ED Prescriptions   None    PDMP not reviewed this encounter.   Mickie Bail, NP 11/05/20 1103

## 2020-11-07 ENCOUNTER — Other Ambulatory Visit: Payer: Self-pay

## 2020-11-07 ENCOUNTER — Encounter: Payer: Self-pay | Admitting: Emergency Medicine

## 2020-11-07 ENCOUNTER — Emergency Department
Admission: EM | Admit: 2020-11-07 | Discharge: 2020-11-07 | Disposition: A | Discharge status: Home / Self Care | Payer: BC Managed Care – PPO | Attending: Emergency Medicine | Admitting: Emergency Medicine

## 2020-11-07 DIAGNOSIS — E119 Type 2 diabetes mellitus without complications: Secondary | ICD-10-CM | POA: Insufficient documentation

## 2020-11-07 DIAGNOSIS — Z79899 Other long term (current) drug therapy: Secondary | ICD-10-CM | POA: Insufficient documentation

## 2020-11-07 DIAGNOSIS — L03115 Cellulitis of right lower limb: Secondary | ICD-10-CM

## 2020-11-07 DIAGNOSIS — W228XXA Striking against or struck by other objects, initial encounter: Secondary | ICD-10-CM | POA: Diagnosis not present

## 2020-11-07 DIAGNOSIS — Z7901 Long term (current) use of anticoagulants: Secondary | ICD-10-CM | POA: Diagnosis not present

## 2020-11-07 DIAGNOSIS — S9001XA Contusion of right ankle, initial encounter: Secondary | ICD-10-CM | POA: Diagnosis not present

## 2020-11-07 DIAGNOSIS — S9031XA Contusion of right foot, initial encounter: Secondary | ICD-10-CM | POA: Insufficient documentation

## 2020-11-07 DIAGNOSIS — I4891 Unspecified atrial fibrillation: Secondary | ICD-10-CM | POA: Diagnosis not present

## 2020-11-07 DIAGNOSIS — I10 Essential (primary) hypertension: Secondary | ICD-10-CM | POA: Diagnosis not present

## 2020-11-07 DIAGNOSIS — Z7984 Long term (current) use of oral hypoglycemic drugs: Secondary | ICD-10-CM | POA: Diagnosis not present

## 2020-11-07 DIAGNOSIS — S99911A Unspecified injury of right ankle, initial encounter: Secondary | ICD-10-CM | POA: Diagnosis present

## 2020-11-07 DIAGNOSIS — M79604 Pain in right leg: Secondary | ICD-10-CM

## 2020-11-07 DIAGNOSIS — Y9389 Activity, other specified: Secondary | ICD-10-CM | POA: Insufficient documentation

## 2020-11-07 DIAGNOSIS — Y9289 Other specified places as the place of occurrence of the external cause: Secondary | ICD-10-CM | POA: Insufficient documentation

## 2020-11-07 MED ORDER — ACETAMINOPHEN 500 MG PO TABS
1000.0000 mg | ORAL_TABLET | Freq: Once | ORAL | Status: AC
  Administered 2020-11-07: 1000 mg via ORAL
  Filled 2020-11-07: qty 2

## 2020-11-07 MED ORDER — CEPHALEXIN 500 MG PO CAPS
500.0000 mg | ORAL_CAPSULE | Freq: Once | ORAL | Status: AC
  Administered 2020-11-07: 500 mg via ORAL
  Filled 2020-11-07: qty 1

## 2020-11-07 MED ORDER — CEPHALEXIN 500 MG PO CAPS: 500.0000 mg | ORAL_CAPSULE | Freq: Three times a day (TID) | ORAL | 0 refills | Status: AC

## 2020-11-07 NOTE — Discharge Instructions (Addendum)
Take the Keflex antibiotic 3 times daily for the next 5 days, and finish all 15 pills, to treat the possibility of cellulitis/skin infection.   When you are able to, elevate the extremity, use compression socks or tight wrappings to help reduce the swelling.  Return to the ED with any worsening symptoms despite this.  Continue your Xarelto and try to avoid any further injuries

## 2020-11-07 NOTE — ED Triage Notes (Signed)
Pt via POV from home. Pt was seen at [facility] in [place] they took XR of his leg and it was negative. Pt has redness noted to the R and L shin and swelling noted. Pt is A&Ox4 and NAD. Pt is ambulatory to room.

## 2020-11-07 NOTE — ED Provider Notes (Signed)
[facility] Emergency Department Provider Note ____________________________________________   Event Date/Time   First MD Initiated Contact with Patient 11/07/20 2034     (approximate)  I have reviewed the triage vital signs and the nursing notes.  HISTORY  Chief Complaint Leg Pain   HPI Connor Stanley is a 70 y.o. malewho presents to the ED for evaluation of leg pain.  Chart review indicates obesity, HTN, DM.  Atrial fibrillation on Xarelto. Plain films at an urgent care 2 days ago of the affected extremity without evidence of fracture.  Patient reports an accidental injury to his right shin 5 days ago when he was carrying a large object, and accidentally ran into some furniture on his porch, causing injury to his distal right shin.  Reports bruising that has settled down to his feet and ankles with associated aching discomfort around there.  Despite Tylenol, he reports increasing pain anteriorly to his right distal shin with spreading erythema over the past couple days.   Denies additional injury, falls, syncope, chest pain, shortness of breath, calf pain or tenderness or proximal leg pain.  Denies any recent antibiotics.  Denies fevers or systemic symptoms.  Past Medical History:  Diagnosis Date   A-fib (HCC)    Acid indigestion    Arthritis    Complication of anesthesia    HARD TO WAKE UP   Diabetes mellitus without complication (HCC)    GERD (gastroesophageal reflux disease)    RARE-NO MEDS   History of kidney stones    H/O   Hypertension    Leaky heart valve    PT STATES HE HAD HEART CATH AND IT SHOWED ONE OF HIS VALVES DID NOT CLOSE PROPERLY-    Patient Active Problem List   Diagnosis Date Noted   Influenza A 05/23/2017   Pneumonia 05/23/2017   Acute renal failure (ARF) (HCC) 05/23/2017    Past Surgical History:  Procedure Laterality Date   CARDIAC CATHETERIZATION     CHOLECYSTECTOMY  04/29/14    Prior to Admission medications    Medication Sig Start Date End Date Taking? Authorizing Provider  cephALEXin (KEFLEX) 500 MG capsule Take 1 capsule (500 mg total) by mouth 3 (three) times daily for 5 days. 11/07/20 11/12/20 Yes Delton Prairie, MD  carvedilol (COREG) 25 MG tablet Take 25 mg by mouth daily.    [provider]  GLUCOSAMINE-CHONDROITIN PO Take 1-2 tablets by mouth daily.    [provider]  metFORMIN (GLUCOPHAGE) 500 MG tablet Take 500 mg by mouth 2 (two) times daily. 07/31/19   [provider]  Multiple Vitamins-Minerals (HM MULTIVITAMIN ADULT GUMMY PO) Take 2 each by mouth daily.    [provider]  XARELTO 20 MG TABS tablet Take 20 mg by mouth daily. 05/19/17   [provider]    Allergies Pollen extract  Family History  Problem Relation Age of Onset   Hypertension Mother     Social History Social History   Tobacco Use   Smoking status: Never   Smokeless tobacco: Never  Vaping Use   Vaping Use: Never used  Substance Use Topics   Alcohol use: No    Alcohol/week: 0.0 standard drinks   Drug use: No    Review of Systems  Constitutional: No fever/chills Eyes: No visual changes. ENT: No sore throat. Cardiovascular: Denies chest pain. Respiratory: Denies shortness of breath. Gastrointestinal: No abdominal pain.  No nausea, no vomiting.  No diarrhea.  No constipation. Genitourinary: Negative for dysuria. Musculoskeletal: Negative  for back pain. Positive for right leg injury. Skin: Positive erythematous rash to his distal right shin. Neurological: Negative for headaches, focal weakness or numbness.  ____________________________________________   PHYSICAL EXAM:  VITAL SIGNS: Vitals:   11/07/20 1748  BP: (!) 188/90  Pulse: 69  Resp: 18  Temp: 98.8 F (37.1 C)  SpO2: 97%    Constitutional: Alert and oriented. Well appearing and in no acute distress. Eyes: Conjunctivae are normal. PERRL. EOMI. Head: Atraumatic. Nose: No  congestion/rhinnorhea. Mouth/Throat: Mucous membranes are moist.  Oropharynx non-erythematous. Neck: No stridor. No cervical spine tenderness to palpation. Cardiovascular: Normal rate, regular rhythm. Grossly normal heart sounds.  Good peripheral circulation. Respiratory: Normal respiratory effort.  No retractions. Lungs CTAB. Gastrointestinal: Soft , nondistended, nontender to palpation. No CVA tenderness. Musculoskeletal: No joint effusions or bleeding injuries. Patchy flat erythema to the distal midline anterior right shin, about 6 x 10 cm.  Some mild induration without fluctuance is noted to the inferior margin of this.  Some mild tenderness to palpation. Bruising has settled with gravity to his distal right ankle and foot.  No impaired range of motion to the ankle, foot or knee. No signs of additional acute trauma. No calf tenderness, popliteal fullness/Baker's cyst or thigh tenderness. Neurologic:  Normal speech and language. No gross focal neurologic deficits are appreciated. No gait instability noted. Skin:  Skin is warm, dry and intact. No rash noted. Psychiatric: Mood and affect are normal. Speech and behavior are normal. ____________________________________________   LABS (all labs ordered are listed, but only abnormal results are displayed)  Labs Reviewed - No data to display ____________________________________________  12 Lead EKG   ____________________________________________  RADIOLOGY  ED MD interpretation:    Official radiology report(s): No results found.  ____________________________________________   PROCEDURES and INTERVENTIONS  Procedure(s) performed (including Critical Care):  Procedures  Medications  acetaminophen (TYLENOL) tablet 1,000 mg (has no administration in time range)  cephALEXin (KEFLEX) capsule 500 mg (has no administration in time range)    ____________________________________________   MDM / ED COURSE   70 year old male  present to the ED with increasing right lower leg pain, with evidence of cellulitis superimposed on recent minor injury, amenable to trial of outpatient management.  Exam is generally reassuring without evidence of neurologic or vascular deficits.  Does have flat erythema with mild induration superimposed on his site of injury, as well as bruising that is settled with gravity down towards his foot.  No signs of acute trauma to the foot.  No additional injury to indicate plain film imaging.  I discussed the patient the possibility of DVT, though unlikely in the setting of Xarelto, and he declines ultrasound due to this high and probability, which I think is reasonable.  We will provide Keflex to empirically treat for cellulitis and discharged with return precautions.    ____________________________________________   FINAL CLINICAL IMPRESSION(S) / ED DIAGNOSES  Final diagnoses:  Right leg pain  Cellulitis of right lower extremity     ED Discharge Orders          Ordered    cephALEXin (KEFLEX) 500 MG capsule  3 times daily        11/07/20 2115             Blythe Hartshorn   Note:  This document was prepared using Dragon voice recognition software and may include unintentional dictation errors.    Delton Prairie, MD 11/07/20 2129

## 2022-05-30 ENCOUNTER — Other Ambulatory Visit: Payer: Self-pay | Admitting: Internal Medicine

## 2022-05-30 DIAGNOSIS — E785 Hyperlipidemia, unspecified: Secondary | ICD-10-CM

## 2022-06-05 IMAGING — DX DG TIBIA/FIBULA 2V*L*
4 series · 4 of 4 positions shown · non-contrast
Comparison: None.

CLINICAL DATA: Left lower leg pain after injury several days ago.

EXAM:
LEFT TIBIA AND FIBULA - 2 VIEW

[tib/fib ap (1 of 2)]
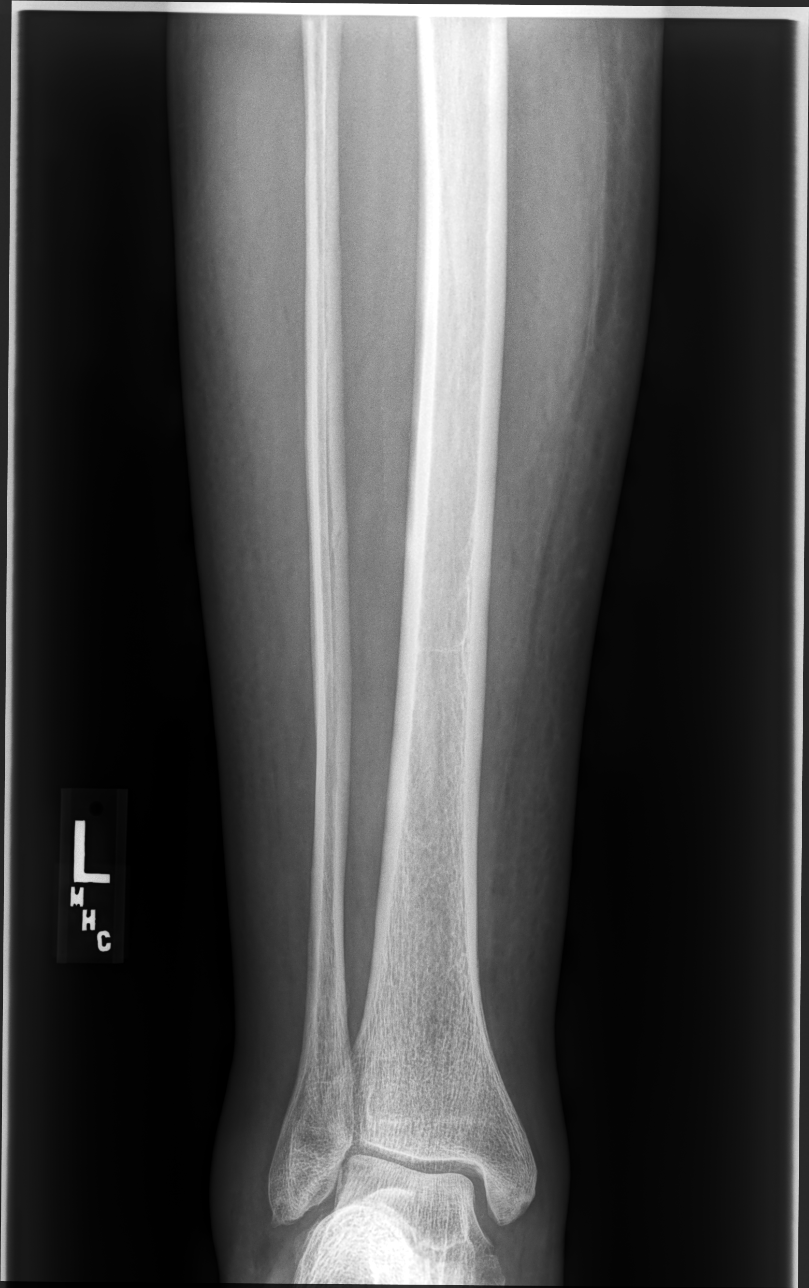

[tib/fib lat (1 of 2)]
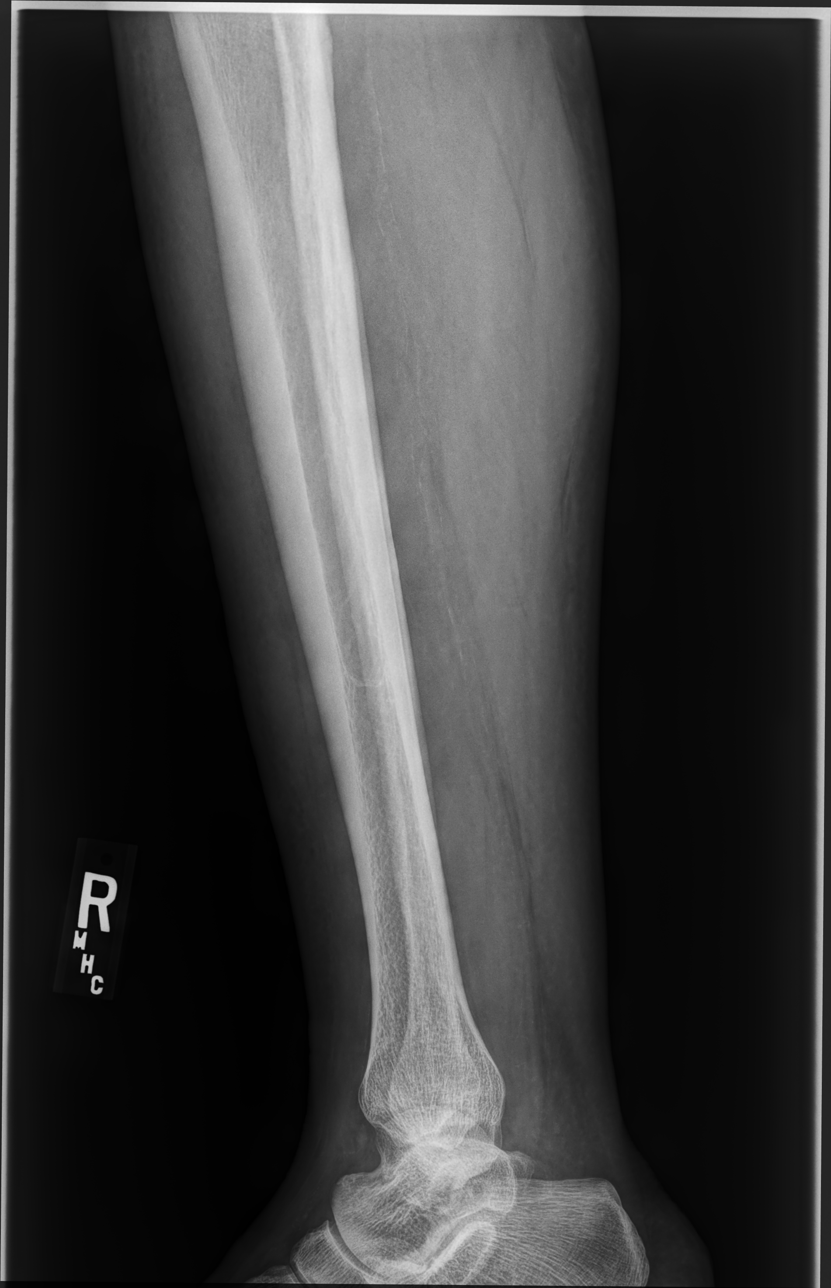

[tib/fib ap (2 of 2)]
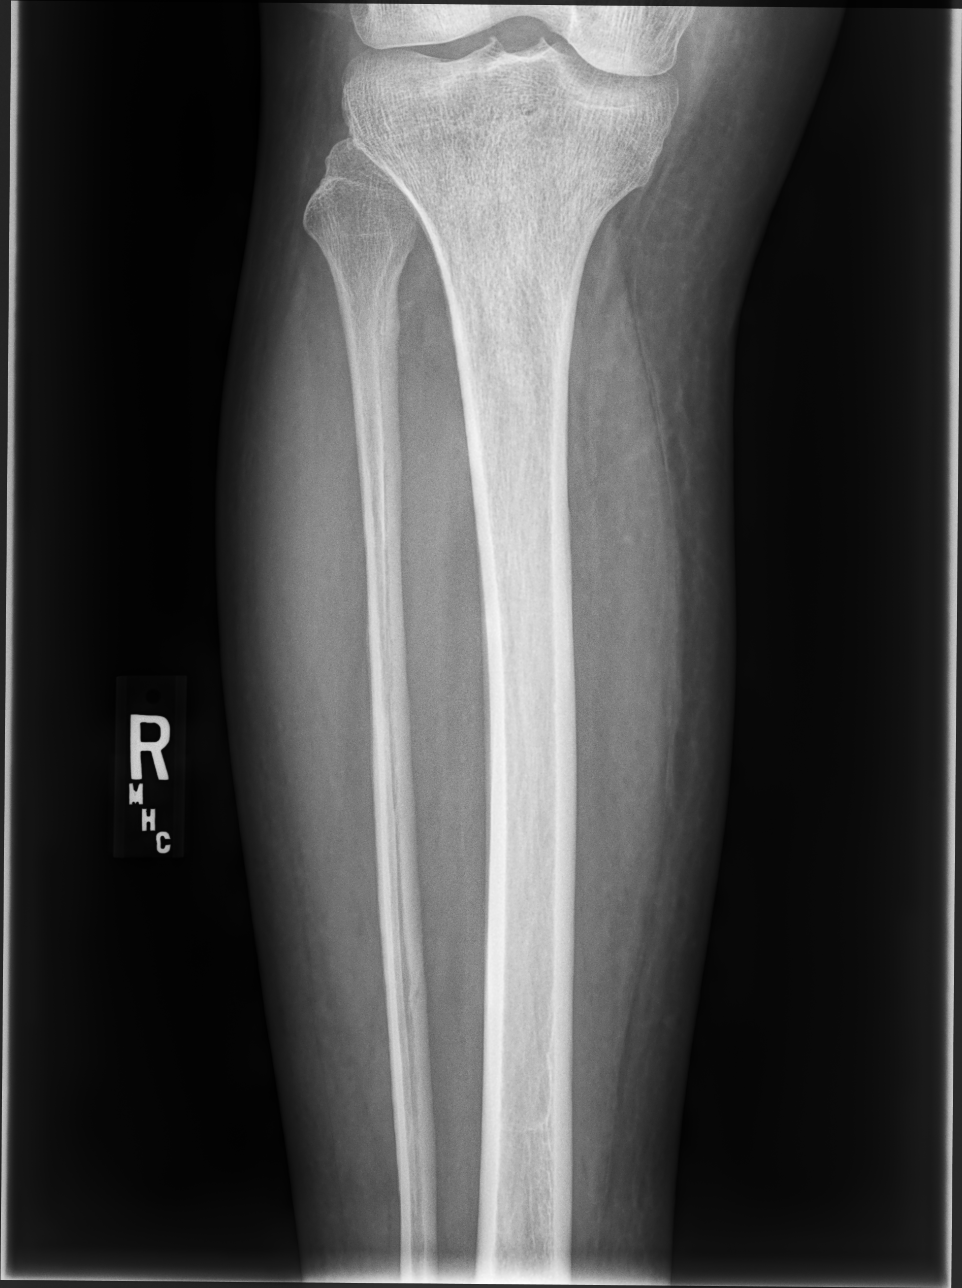

[tib/fib lat (2 of 2)]
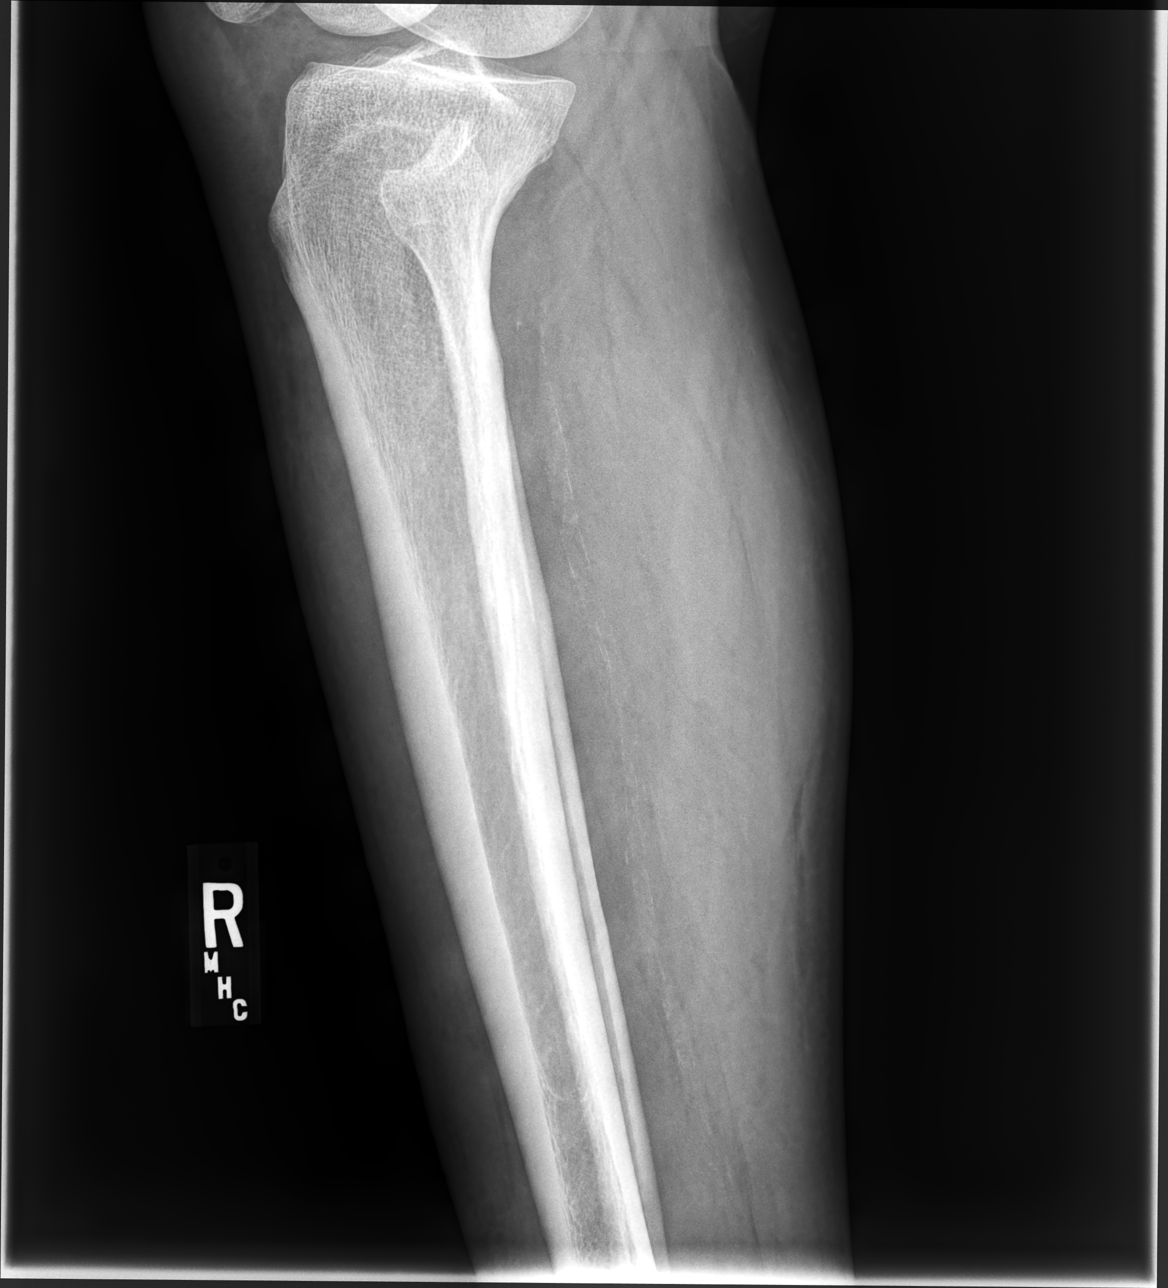

[4 of 4 positions shown; findings below may reference images not displayed]

FINDINGS: There is no evidence of fracture or other focal bone lesions. Soft
tissues are unremarkable.
IMPRESSION: Negative.

## 2022-06-07 ENCOUNTER — Ambulatory Visit: Payer: BLUE CROSS/BLUE SHIELD | Admitting: Podiatry

## 2022-06-21 ENCOUNTER — Ambulatory Visit: Payer: BLUE CROSS/BLUE SHIELD | Admitting: Internal Medicine

## 2022-07-06 ENCOUNTER — Ambulatory Visit: Payer: BC Managed Care – PPO | Admitting: Internal Medicine

## 2022-07-26 ENCOUNTER — Ambulatory Visit: Payer: BC Managed Care – PPO | Admitting: Internal Medicine

## 2022-08-16 ENCOUNTER — Ambulatory Visit (INDEPENDENT_AMBULATORY_CARE_PROVIDER_SITE_OTHER): Payer: BC Managed Care – PPO | Admitting: Internal Medicine

## 2022-08-16 VITALS — BP 150/90 | HR 63 | Ht 70.0 in | Wt 254.6 lb

## 2022-08-16 DIAGNOSIS — E782 Mixed hyperlipidemia: Secondary | ICD-10-CM | POA: Diagnosis not present

## 2022-08-16 DIAGNOSIS — Z0001 Encounter for general adult medical examination with abnormal findings: Secondary | ICD-10-CM | POA: Diagnosis not present

## 2022-08-16 DIAGNOSIS — E119 Type 2 diabetes mellitus without complications: Secondary | ICD-10-CM | POA: Diagnosis not present

## 2022-08-16 DIAGNOSIS — I10 Essential (primary) hypertension: Secondary | ICD-10-CM | POA: Diagnosis not present

## 2022-08-16 DIAGNOSIS — I4811 Longstanding persistent atrial fibrillation: Secondary | ICD-10-CM

## 2022-08-16 DIAGNOSIS — M778 Other enthesopathies, not elsewhere classified: Secondary | ICD-10-CM | POA: Insufficient documentation

## 2022-08-16 NOTE — Progress Notes (Signed)
Established Patient Office Visit  Subjective:  Patient ID: Connor Stanley, male    DOB: September 08, 1950  Age: 72 y.o. MRN: [id number]  Chief Complaint  Patient presents with   Follow-up    3 mo Lab F/U    No new complaints, here for AWV refer to quality metrics and scanned documents.  Recently bereaved with loss of his wife. C/o fatigue, sleeps 5 hrs/night on average. Denies cp, palpitations or SOB. Reports intermittent pain in the base of his left thumb.   No other concerns at this time.   Past Medical History:  Diagnosis Date   A-fib (HCC)    Acid indigestion    Arthritis    Complication of anesthesia    HARD TO WAKE UP   Diabetes mellitus without complication (HCC)    GERD (gastroesophageal reflux disease)    RARE-NO MEDS   History of kidney stones    H/O   Hypertension    Leaky heart valve    PT STATES HE HAD HEART CATH AND IT SHOWED ONE OF HIS VALVES DID NOT CLOSE PROPERLY-    Past Surgical History:  Procedure Laterality Date   CARDIAC CATHETERIZATION     CHOLECYSTECTOMY  04/29/14    Social History   Socioeconomic History   Marital status: Married    Spouse name: Not on file   Number of children: Not on file   Years of education: Not on file   Highest education level: Not on file  Occupational History   Not on file  Tobacco Use   Smoking status: Never   Smokeless tobacco: Never  Vaping Use   Vaping Use: Never used  Substance and Sexual Activity   Alcohol use: No    Alcohol/week: 0.0 standard drinks of alcohol   Drug use: No   Sexual activity: Not on file  Other Topics Concern   Not on file  Social History Narrative   Lives at home with family   Social Determinants of Health   Financial Resource Strain: Low Risk  (05/24/2017)   Overall Financial Resource Strain (CARDIA)    Difficulty of Paying Living Expenses: Not hard at all  Food Insecurity: No Food Insecurity (05/24/2017)   Hunger Vital Sign    Worried About Running Out of Food in the Last Year:  Never true    Ran Out of Food in the Last Year: Never true  Transportation Needs: No Transportation Needs (05/24/2017)   PRAPARE - Administrator, Civil Service (Medical): No    Lack of Transportation (Non-Medical): No  Physical Activity: Inactive (05/24/2017)   Exercise Vital Sign    Days of Exercise per Week: 0 days    Minutes of Exercise per Session: 0 min  Stress: No Stress Concern Present (05/24/2017)   [employer] of Occupational Health - Occupational Stress Questionnaire    Feeling of Stress : Only a little  Social Connections: Moderately Integrated (05/24/2017)   Social Connection and Isolation Panel [NHANES]    Frequency of Communication with Friends and Family: Three times a week    Frequency of Social Gatherings with Friends and Family: Once a week    Attends Religious Services: 1 to 4 times per year    Active Member of [employer] or Organizations: No    Attends Banker Meetings: Never    Marital Status: Married  Catering manager Violence: Not At Risk (05/24/2017)   Humiliation, Afraid, Rape, and Kick questionnaire    Fear of Current or Ex-Partner: No  Emotionally Abused: No    Physically Abused: No    Sexually Abused: No    Family History  Problem Relation Age of Onset   Hypertension Mother     Allergies  Allergen Reactions   Pollen Extract     Review of Systems  Constitutional: Negative.   HENT: Negative.    Eyes: Negative.   Respiratory: Negative.    Cardiovascular: Negative.   Gastrointestinal: Negative.   Genitourinary: Negative.   Skin: Negative.   Neurological: Negative.   Endo/Heme/Allergies: Negative.        Objective:   BP (!) 150/90   Pulse 63   Ht 5\' 10"  (1.778 m)   Wt 254 lb 9.6 oz (115.5 kg)   SpO2 97%   BMI 36.53 kg/m   Vitals:   08/16/22 1036  BP: (!) 150/90  Pulse: 63  Height: 5\' 10"  (1.778 m)  Weight: 254 lb 9.6 oz (115.5 kg)  SpO2: 97%  BMI (Calculated): 36.53    Physical Exam Vitals reviewed.   Constitutional:      Appearance: Normal appearance. He is obese.  HENT:     Head: Normocephalic.     Left Ear: There is no impacted cerumen.     Nose: Nose normal.     Mouth/Throat:     Mouth: Mucous membranes are moist.     Pharynx: No posterior oropharyngeal erythema.  Eyes:     Extraocular Movements: Extraocular movements intact.     Pupils: Pupils are equal, round, and reactive to light.  Cardiovascular:     Rate and Rhythm: Regular rhythm.     Chest Wall: PMI is not displaced.     Pulses: Normal pulses.     Heart sounds: Normal heart sounds. No murmur heard. Pulmonary:     Effort: Pulmonary effort is normal.     Breath sounds: Normal air entry. No rhonchi or rales.  Abdominal:     General: Abdomen is flat. Bowel sounds are normal. There is no distension.     Palpations: Abdomen is soft. There is no hepatomegaly, splenomegaly or mass.     Tenderness: There is no abdominal tenderness.  Musculoskeletal:        General: No tenderness (over base of left thumb). Normal range of motion.     Cervical back: Normal range of motion and neck supple.     Right lower leg: No edema.     Left lower leg: No edema.  Skin:    General: Skin is warm and dry.  Neurological:     General: No focal deficit present.     Mental Status: He is alert and oriented to person, place, and time.     Cranial Nerves: No cranial nerve deficit.     Motor: No weakness.  Psychiatric:        Mood and Affect: Mood normal.        Behavior: Behavior normal.      No results found for any visits on 08/16/22.  No results found for this or any previous visit (from the past 2160 hour(Vaidehi Braddy)).    Assessment & Plan:  As per problem list. Administer steroids next exacerbation of tendonitis.  Problem List Items Addressed This Visit       Cardiovascular and Mediastinum   Longstanding persistent atrial fibrillation (HCC)   Primary hypertension   Relevant Orders   CBC With Diff/Platelet   Comprehensive  metabolic panel     Endocrine   Controlled type 2 diabetes mellitus without complication, without  long-term current use of insulin (HCC)   Relevant Orders   Hemoglobin A1c     Other   Mixed hyperlipidemia - Primary   Relevant Orders   Lipid panel   Comprehensive metabolic panel    Return in about 3 months (around 11/16/2022) for fu with labs prior.   Total time spent: 40 minutes  Luna Fuse, MD  08/16/2022   This document may have been prepared by [facility] Voice Recognition software and as such may include unintentional dictation errors.

## 2022-11-22 ENCOUNTER — Ambulatory Visit: Payer: BC Managed Care – PPO | Admitting: Internal Medicine

## 2023-01-12 ENCOUNTER — Other Ambulatory Visit: Payer: Self-pay

## 2023-01-16 MED ORDER — CARVEDILOL 25 MG PO TABS: 25.0000 mg | ORAL_TABLET | Freq: Every day | ORAL | 0 refills | Status: DC

## 2023-02-09 ENCOUNTER — Emergency Department
Admission: EM | Admit: 2023-02-09 | Discharge: 2023-02-09 | Disposition: A | Discharge status: Home / Self Care | Payer: Medicare Other | Attending: Emergency Medicine | Admitting: Emergency Medicine

## 2023-02-09 ENCOUNTER — Other Ambulatory Visit: Payer: Self-pay

## 2023-02-09 DIAGNOSIS — B029 Zoster without complications: Secondary | ICD-10-CM | POA: Insufficient documentation

## 2023-02-09 DIAGNOSIS — E119 Type 2 diabetes mellitus without complications: Secondary | ICD-10-CM | POA: Insufficient documentation

## 2023-02-09 DIAGNOSIS — R21 Rash and other nonspecific skin eruption: Secondary | ICD-10-CM | POA: Diagnosis present

## 2023-02-09 DIAGNOSIS — I10 Essential (primary) hypertension: Secondary | ICD-10-CM | POA: Diagnosis not present

## 2023-02-09 MED ORDER — FAMCICLOVIR 500 MG PO TABS: 500.0000 mg | ORAL_TABLET | Freq: Three times a day (TID) | ORAL | 0 refills | Status: AC

## 2023-02-09 MED ORDER — METHYLPREDNISOLONE 4 MG PO TBPK: ORAL_TABLET | ORAL | 0 refills | Status: DC

## 2023-02-09 MED ORDER — OXYCODONE-ACETAMINOPHEN 5-325 MG PO TABS: 1.0000 | ORAL_TABLET | ORAL | 0 refills | Status: DC | PRN

## 2023-02-09 NOTE — ED Triage Notes (Signed)
Pt here with a rash on his forehead x5 days. Pt states now he has swollen glands on both sides of his throat and pain at the top of his head along with the rash spreading. Pt denies new medications, foods, or detergents. Pt denies NVD or fevers.

## 2023-02-09 NOTE — ED Provider Notes (Signed)
[facility] Provider Note    Event Date/Time   First MD Initiated Contact with Patient 02/09/23 [phone]     (approximate)   History   Rash   HPI  Connor Stanley is a 72 y.o. male with history of hypertension, GERD, A-fib, diabetes presents emergency department with complaints of a rash on the right side of his face.  States woke up with painful today.  Rash has been present for about 5 days.  No eye pain.  Does have a regular eye doctor.      Physical Exam   Triage Vital Signs: ED Triage Vitals  Encounter Vitals Group     BP 02/09/23 0900 (!) 204/91     Systolic BP Percentile --      Diastolic BP Percentile --      Pulse Rate 02/09/23 0859 63     Resp 02/09/23 0859 17     Temp 02/09/23 0859 98.7 F (37.1 C)     Temp Source 02/09/23 0859 Oral     SpO2 02/09/23 0859 99 %     Weight 02/09/23 0859 254 lb 10.1 oz (115.5 kg)     Height 02/09/23 0859 5\' 10"  (1.778 m)     Head Circumference --      Peak Flow --      Pain Score 02/09/23 0859 9     Pain Loc --      Pain Education --      Exclude from Growth Chart --     Most recent vital signs: Vitals:   02/09/23 0859 02/09/23 0900  BP:  (!) 204/91  Pulse: 63   Resp: 17   Temp: 98.7 F (37.1 C)   SpO2: 99%      General: Awake, no distress.   CV:  Good peripheral perfusion. regular rate and  rhythm Resp:  Normal effort.  Abd:  No distention.   Other:  Skin with clusters of red blisterlike areas in the dermatome of the forehead on the right side   ED Results / Procedures / Treatments   Labs (all labs ordered are listed, but only abnormal results are displayed) Labs Reviewed - No data to display   EKG     RADIOLOGY     PROCEDURES:   Procedures   MEDICATIONS ORDERED IN ED: Medications - No data to display   IMPRESSION / MDM / ASSESSMENT AND PLAN / ED COURSE  I reviewed the triage vital signs and the nursing notes.                              Differential diagnosis  includes, but is not limited to, shingles, cellulitis, abscess  Patient's presentation is most consistent with acute complicated illness / injury requiring diagnostic workup.   I did explain all findings to patient.  Do not feel we need further workup at this time.  Will start him on Famvir, Medrol Dosepak, and Percocet for pain as needed.  Patient is to follow-up with his regular eye doctor.  States he can call and make an appointment.  Return emergency department worsening.  Patient was discharged stable condition and is in agreement treatment plan.      FINAL CLINICAL IMPRESSION(S) / ED DIAGNOSES   Final diagnoses:  Herpes zoster without complication     Rx / DC Orders   ED Discharge Orders          Ordered  famciclovir (FAMVIR) 500 MG tablet  3 times daily        02/09/23 0916    methylPREDNISolone (MEDROL DOSEPAK) 4 MG TBPK tablet        02/09/23 0916    oxyCODONE-acetaminophen (PERCOCET) 5-325 MG tablet  Every 4 hours PRN        02/09/23 0916             Note:  This document was prepared using Dragon voice recognition software and may include unintentional dictation errors.    Faythe Ghee, PA-C 02/09/23 4010    Corena Herter, MD 02/09/23 1736

## 2023-02-09 NOTE — ED Notes (Signed)
See triage note  Presents with rash to face/forehead  States he noticed this about 5 days ago  Then he noticed a swollen gland yesterday no fever

## 2023-03-22 ENCOUNTER — Other Ambulatory Visit: Payer: Self-pay | Admitting: Internal Medicine

## 2023-10-12 ENCOUNTER — Other Ambulatory Visit

## 2023-10-12 DIAGNOSIS — E782 Mixed hyperlipidemia: Secondary | ICD-10-CM

## 2023-10-12 DIAGNOSIS — E119 Type 2 diabetes mellitus without complications: Secondary | ICD-10-CM | POA: Diagnosis not present

## 2023-10-13 LAB — CMP14+EGFR
ALT: 14 IU/L (ref 0–44)
AST: 18 IU/L (ref 0–40)
Albumin: 3.9 g/dL (ref 3.8–4.8)
Alkaline Phosphatase: 91 IU/L (ref 44–121)
BUN/Creatinine Ratio: 13 (ref 10–24)
BUN: 13 mg/dL (ref 8–27)
Bilirubin Total: 0.6 mg/dL (ref 0.0–1.2)
CO2: 19 mmol/L — ABNORMAL LOW (ref 20–29)
Calcium: 9 mg/dL (ref 8.6–10.2)
Chloride: 107 mmol/L — ABNORMAL HIGH (ref 96–106)
Creatinine, Ser: 0.98 mg/dL (ref 0.76–1.27)
Globulin, Total: 2.5 g/dL (ref 1.5–4.5)
Glucose: 131 mg/dL — ABNORMAL HIGH (ref 70–99)
Potassium: 4.2 mmol/L (ref 3.5–5.2)
Sodium: 139 mmol/L (ref 134–144)
Total Protein: 6.4 g/dL (ref 6.0–8.5)
eGFR: 81 mL/min/1.73 (ref >59)

## 2023-10-13 LAB — HEMOGLOBIN A1C
Est. average glucose Bld gHb Est-mCnc: 128 mg/dL
Hgb A1c MFr Bld: 6.1 % — ABNORMAL HIGH (ref 4.8–5.6)

## 2023-10-13 LAB — LIPID PANEL
Chol/HDL Ratio: 5.8 ratio — ABNORMAL HIGH (ref 0.0–5.0)
Cholesterol, Total: 180 mg/dL (ref 100–199)
HDL: 31 mg/dL — ABNORMAL LOW (ref >39)
LDL Chol Calc (NIH): 108 mg/dL — ABNORMAL HIGH (ref 0–99)
Triglycerides: 234 mg/dL — ABNORMAL HIGH (ref 0–149)
VLDL Cholesterol Cal: 41 mg/dL — ABNORMAL HIGH (ref 5–40)

## 2023-10-17 ENCOUNTER — Ambulatory Visit: Payer: Self-pay | Admitting: Internal Medicine

## 2023-10-17 ENCOUNTER — Ambulatory Visit (INDEPENDENT_AMBULATORY_CARE_PROVIDER_SITE_OTHER): Admitting: Internal Medicine

## 2023-10-17 ENCOUNTER — Encounter: Payer: Self-pay | Admitting: Internal Medicine

## 2023-10-17 VITALS — BP 129/79 | HR 66 | Temp 97.9°F | Ht 70.0 in | Wt 239.2 lb

## 2023-10-17 DIAGNOSIS — E119 Type 2 diabetes mellitus without complications: Secondary | ICD-10-CM | POA: Diagnosis not present

## 2023-10-17 DIAGNOSIS — I4811 Longstanding persistent atrial fibrillation: Secondary | ICD-10-CM

## 2023-10-17 DIAGNOSIS — E782 Mixed hyperlipidemia: Secondary | ICD-10-CM | POA: Diagnosis not present

## 2023-10-17 DIAGNOSIS — E66811 Obesity, class 1: Secondary | ICD-10-CM | POA: Insufficient documentation

## 2023-10-17 DIAGNOSIS — I10 Essential (primary) hypertension: Secondary | ICD-10-CM | POA: Diagnosis not present

## 2023-10-17 LAB — POCT CBG (FASTING - GLUCOSE)-MANUAL ENTRY: Glucose Fasting, POC: 137 mg/dL — AB (ref 70–99)

## 2023-10-17 MED ORDER — NEXLIZET 180-10 MG PO TABS: 1.0000 | ORAL_TABLET | Freq: Every day | ORAL | 2 refills | Status: AC

## 2023-10-17 MED ORDER — RYBELSUS 3 MG PO TABS: 3.0000 mg | ORAL_TABLET | Freq: Every day | ORAL | Status: AC

## 2023-10-17 MED ORDER — RYBELSUS 7 MG PO TABS: 7.0000 mg | ORAL_TABLET | Freq: Every day | ORAL | 0 refills | Status: DC

## 2023-10-17 NOTE — Progress Notes (Signed)
 Established Patient Office Visit  Subjective:  Patient ID: Connor Stanley, male    DOB: 31-Dec-1950  Age: 73 y.o. MRN: [id number]  Chief Complaint  Patient presents with   Follow-up    6 month follow up with lab results.     No new complaints, here for lab review and medication refills. Labs reviewed and notable for well controlled diabetes, A1c at target, lipids not at target with unremarkable cmp. Denies any hypoglycemic episodes and home bg readings have been at target. Noncompliant with statin due to headaches and stopped metformin.     No other concerns at this time.   Past Medical History:  Diagnosis Date   A-fib (HCC)    Acid indigestion    Arthritis    Complication of anesthesia    HARD TO WAKE UP   Diabetes mellitus without complication (HCC)    GERD (gastroesophageal reflux disease)    RARE-NO MEDS   History of kidney stones    H/O   Hypertension    Leaky heart valve    PT STATES HE HAD HEART CATH AND IT SHOWED ONE OF HIS VALVES DID NOT CLOSE PROPERLY-    Past Surgical History:  Procedure Laterality Date   CARDIAC CATHETERIZATION     CHOLECYSTECTOMY  04/29/14    Social History   Socioeconomic History   Marital status: Married    Spouse name: Not on file   Number of children: Not on file   Years of education: Not on file   Highest education level: Not on file  Occupational History   Not on file  Tobacco Use   Smoking status: Never   Smokeless tobacco: Never  Vaping Use   Vaping status: Never Used  Substance and Sexual Activity   Alcohol use: No    Alcohol/week: 0.0 standard drinks of alcohol   Drug use: No   Sexual activity: Not on file  Other Topics Concern   Not on file  Social History Narrative   Lives at home with family   Social Drivers of Health   Financial Resource Strain: Low Risk  (05/24/2017)   Overall Financial Resource Strain (CARDIA)    Difficulty of Paying Living Expenses: Not hard at all  Food Insecurity: No Food Insecurity  (05/24/2017)   Hunger Vital Sign    Worried About Running Out of Food in the Last Year: Never true    Ran Out of Food in the Last Year: Never true  Transportation Needs: No Transportation Needs (05/24/2017)   PRAPARE - Administrator, Civil Service (Medical): No    Lack of Transportation (Non-Medical): No  Physical Activity: Inactive (05/24/2017)   Exercise Vital Sign    Days of Exercise per Week: 0 days    Minutes of Exercise per Session: 0 min  Stress: No Stress Concern Present (05/24/2017)   [employer] of Occupational Health - Occupational Stress Questionnaire    Feeling of Stress : Only a little  Social Connections: Moderately Integrated (05/24/2017)   Social Connection and Isolation Panel    Frequency of Communication with Friends and Family: Three times a week    Frequency of Social Gatherings with Friends and Family: Once a week    Attends Religious Services: 1 to 4 times per year    Active Member of [employer] or Organizations: No    Attends Banker Meetings: Never    Marital Status: Married  Catering manager Violence: Not At Risk (05/24/2017)   Humiliation, Afraid, Rape, and  Kick questionnaire    Fear of Current or Ex-Partner: No    Emotionally Abused: No    Physically Abused: No    Sexually Abused: No    Family History  Problem Relation Age of Onset   Hypertension Mother     Allergies  Allergen Reactions   Pollen Extract     Outpatient Medications Prior to Visit  Medication Sig Note   carvedilol  (COREG ) 25 MG tablet TAKE 1 TABLET EVERY DAY (NEED AN APPOINTMENT WITH LABS)    metFORMIN (GLUCOPHAGE) 500 MG tablet Take 500 mg by mouth 2 (two) times daily. (Patient not taking: Reported on 10/17/2023)    oxyCODONE -acetaminophen  (PERCOCET) 5-325 MG tablet Take 1 tablet by mouth every 4 (four) hours as needed for severe pain (pain score 7-10). (Patient not taking: Reported on 10/17/2023)    [DISCONTINUED] atorvastatin (LIPITOR) 20 MG tablet TAKE 1 TABLET BY  MOUTH EVERY DAY IN THE EVENING (Patient not taking: Reported on 10/17/2023) 10/17/2023: headaches   [DISCONTINUED] GLUCOSAMINE-CHONDROITIN PO Take 1-2 tablets by mouth daily. (Patient not taking: Reported on 10/17/2023)    [DISCONTINUED] methylPREDNISolone  (MEDROL  DOSEPAK) 4 MG TBPK tablet Take 6 pills on day one then decrease by 1 pill each day (Patient not taking: Reported on 10/17/2023)    [DISCONTINUED] Multiple Vitamins-Minerals (HM MULTIVITAMIN ADULT GUMMY PO) Take 2 each by mouth daily. (Patient not taking: Reported on 10/17/2023)    [DISCONTINUED] XARELTO  20 MG TABS tablet Take 20 mg by mouth daily. (Patient not taking: Reported on 10/17/2023)    No facility-administered medications prior to visit.    Review of Systems  Constitutional:  Positive for weight loss (15 lbs).  HENT: Negative.    Eyes: Negative.   Respiratory: Negative.    Cardiovascular: Negative.   Gastrointestinal: Negative.   Genitourinary: Negative.   Skin: Negative.   Neurological: Negative.   Endo/Heme/Allergies: Negative.        Objective:   BP 129/79   Pulse 66   Temp 97.9 F (36.6 C)   Ht 5' 10 (1.778 m)   Wt 239 lb 3.2 oz (108.5 kg)   SpO2 98%   BMI 34.32 kg/m   Vitals:   10/17/23 0940  BP: 129/79  Pulse: 66  Temp: 97.9 F (36.6 C)  Height: 5' 10 (1.778 m)  Weight: 239 lb 3.2 oz (108.5 kg)  SpO2: 98%  BMI (Calculated): 34.32    Physical Exam Vitals reviewed.  Constitutional:      Appearance: Normal appearance. He is obese.  HENT:     Head: Normocephalic.     Left Ear: There is no impacted cerumen.     Nose: Nose normal.     Mouth/Throat:     Mouth: Mucous membranes are moist.     Pharynx: No posterior oropharyngeal erythema.  Eyes:     Extraocular Movements: Extraocular movements intact.     Pupils: Pupils are equal, round, and reactive to light.  Cardiovascular:     Rate and Rhythm: Regular rhythm.     Chest Wall: PMI is not displaced.     Pulses: Normal pulses.     Heart  sounds: Normal heart sounds. No murmur heard. Pulmonary:     Effort: Pulmonary effort is normal.     Breath sounds: Normal air entry. No rhonchi or rales.  Abdominal:     General: Abdomen is flat. Bowel sounds are normal. There is no distension.     Palpations: Abdomen is soft. There is no hepatomegaly, splenomegaly or mass.  Tenderness: There is no abdominal tenderness.  Musculoskeletal:        General: No tenderness (over base of left thumb). Normal range of motion.     Cervical back: Normal range of motion and neck supple.     Right lower leg: No edema.     Left lower leg: No edema.  Skin:    General: Skin is warm and dry.  Neurological:     General: No focal deficit present.     Mental Status: He is alert and oriented to person, place, and time.     Cranial Nerves: No cranial nerve deficit.     Motor: No weakness.  Psychiatric:        Mood and Affect: Mood normal.        Behavior: Behavior normal.      Results for orders placed or performed in visit on 10/17/23  POCT CBG (Fasting - Glucose)  Result Value Ref Range   Glucose Fasting, POC 137 (A) 70 - 99 mg/dL    Recent Results (from the past 2160 hours)  CMP14+EGFR     Status: Abnormal   Collection Time: 10/12/23 11:39 AM  Result Value Ref Range   Glucose 131 (H) 70 - 99 mg/dL   BUN 13 8 - 27 mg/dL   Creatinine, Ser 9.01 0.76 - 1.27 mg/dL   eGFR 81 >40 fO/fpw/8.26   BUN/Creatinine Ratio 13 10 - 24   Sodium 139 134 - 144 mmol/L   Potassium 4.2 3.5 - 5.2 mmol/L   Chloride 107 (H) 96 - 106 mmol/L   CO2 19 (L) 20 - 29 mmol/L   Calcium 9.0 8.6 - 10.2 mg/dL   Total Protein 6.4 6.0 - 8.5 g/dL   Albumin 3.9 3.8 - 4.8 g/dL   Globulin, Total 2.5 1.5 - 4.5 g/dL   Bilirubin Total 0.6 0.0 - 1.2 mg/dL   Alkaline Phosphatase 91 44 - 121 IU/L   AST 18 0 - 40 IU/L   ALT 14 0 - 44 IU/L  Hemoglobin A1c     Status: Abnormal   Collection Time: 10/12/23 11:39 AM  Result Value Ref Range   Hgb A1c MFr Bld 6.1 (H) 4.8 - 5.6 %     Comment:          Prediabetes: 5.7 - 6.4          Diabetes: >6.4          Glycemic control for adults with diabetes: <7.0    Est. average glucose Bld gHb Est-mCnc 128 mg/dL  Lipid panel     Status: Abnormal   Collection Time: 10/12/23 11:39 AM  Result Value Ref Range   Cholesterol, Total 180 100 - 199 mg/dL   Triglycerides 765 (H) 0 - 149 mg/dL   HDL 31 (L) >60 mg/dL   VLDL Cholesterol Cal 41 (H) 5 - 40 mg/dL   LDL Chol Calc (NIH) 891 (H) 0 - 99 mg/dL   Chol/HDL Ratio 5.8 (H) 0.0 - 5.0 ratio    Comment:                                   T. Chol/HDL Ratio                                             Men  Women                               1/2 Avg.Risk  3.4    3.3                                   Avg.Risk  5.0    4.4                                2X Avg.Risk  9.6    7.1                                3X Avg.Risk 23.4   11.0   POCT CBG (Fasting - Glucose)     Status: Abnormal   Collection Time: 10/17/23  9:46 AM  Result Value Ref Range   Glucose Fasting, POC 137 (A) 70 - 99 mg/dL      Assessment & Plan:  Othman was seen today for follow-up.  Controlled type 2 diabetes mellitus without complication, without long-term current use of insulin (HCC) -     POCT CBG (Fasting - Glucose) -     Rybelsus ; Take 1 tablet (7 mg total) by mouth daily.  Dispense: 30 tablet; Refill: 0 -     Rybelsus ; Take 1 tablet (3 mg total) by mouth daily.  Mixed hyperlipidemia -     Nexlizet ; Take 1 tablet by mouth daily.  Dispense: 30 tablet; Refill: 2  Primary hypertension  Longstanding persistent atrial fibrillation (HCC)  Obesity (BMI 30.0-34.9)    Problem List Items Addressed This Visit       Cardiovascular and Mediastinum   Longstanding persistent atrial fibrillation (HCC)   Primary hypertension     Endocrine   Controlled type 2 diabetes mellitus without complication, without long-term current use of insulin (HCC) - Primary   Relevant Orders   POCT CBG (Fasting - Glucose) (Completed)      Other   Mixed hyperlipidemia   Obesity (BMI 30.0-34.9)    Return in about 6 weeks (around 11/28/2023) for awv , Weight management.   Total time spent: 20 minutes  Sherrill Cinderella Perry, MD  10/17/2023   This document may have been prepared by [facility] Voice Recognition software and as such may include unintentional dictation errors.

## 2023-11-22 ENCOUNTER — Other Ambulatory Visit: Payer: Self-pay | Admitting: Internal Medicine

## 2023-11-22 DIAGNOSIS — E119 Type 2 diabetes mellitus without complications: Secondary | ICD-10-CM

## 2023-11-28 ENCOUNTER — Ambulatory Visit: Admitting: Internal Medicine

## 2023-11-28 VITALS — BP 140/82 | HR 75 | Temp 97.8°F | Ht 70.0 in | Wt 234.8 lb

## 2023-11-28 DIAGNOSIS — I10 Essential (primary) hypertension: Secondary | ICD-10-CM | POA: Diagnosis not present

## 2023-11-28 DIAGNOSIS — B351 Tinea unguium: Secondary | ICD-10-CM | POA: Diagnosis not present

## 2023-11-28 DIAGNOSIS — E782 Mixed hyperlipidemia: Secondary | ICD-10-CM

## 2023-11-28 DIAGNOSIS — N400 Enlarged prostate without lower urinary tract symptoms: Secondary | ICD-10-CM

## 2023-11-28 DIAGNOSIS — Z0001 Encounter for general adult medical examination with abnormal findings: Secondary | ICD-10-CM

## 2023-11-28 DIAGNOSIS — E119 Type 2 diabetes mellitus without complications: Secondary | ICD-10-CM

## 2023-11-28 LAB — POCT CBG (FASTING - GLUCOSE)-MANUAL ENTRY: Glucose Fasting, POC: 115 mg/dL — AB (ref 70–99)

## 2023-11-28 NOTE — Progress Notes (Signed)
 Established Patient Office Visit  Subjective:  Patient ID: Connor Stanley, male    DOB: 05/12/1950  Age: 73 y.o. MRN: [id number]  Chief Complaint  Patient presents with   Annual Exam    6 week AWV    No new complaints, here for AWV refer to quality metrics and scanned documents. Also here for lab review and medication refills. Failed to have previsit labs done. Only started 7 mg of Rybelsus  a week ago.       No other concerns at this time.   Past Medical History:  Diagnosis Date   A-fib (HCC)    Acid indigestion    Arthritis    Complication of anesthesia    HARD TO WAKE UP   Diabetes mellitus without complication (HCC)    GERD (gastroesophageal reflux disease)    RARE-NO MEDS   History of kidney stones    H/O   Hypertension    Leaky heart valve    PT STATES HE HAD HEART CATH AND IT SHOWED ONE OF HIS VALVES DID NOT CLOSE PROPERLY-    Past Surgical History:  Procedure Laterality Date   CARDIAC CATHETERIZATION     CHOLECYSTECTOMY  04/29/14    Social History   Socioeconomic History   Marital status: Married    Spouse name: Not on file   Number of children: Not on file   Years of education: Not on file   Highest education level: Not on file  Occupational History   Not on file  Tobacco Use   Smoking status: Never   Smokeless tobacco: Never  Vaping Use   Vaping status: Never Used  Substance and Sexual Activity   Alcohol use: No    Alcohol/week: 0.0 standard drinks of alcohol   Drug use: No   Sexual activity: Not on file  Other Topics Concern   Not on file  Social History Narrative   Lives at home with family   Social Drivers of Health   Financial Resource Strain: Low Risk  (05/24/2017)   Overall Financial Resource Strain (CARDIA)    Difficulty of Paying Living Expenses: Not hard at all  Food Insecurity: No Food Insecurity (05/24/2017)   Hunger Vital Sign    Worried About Running Out of Food in the Last Year: Never true    Ran Out of Food in the Last Year:  Never true  Transportation Needs: No Transportation Needs (05/24/2017)   PRAPARE - Administrator, Civil Service (Medical): No    Lack of Transportation (Non-Medical): No  Physical Activity: Inactive (05/24/2017)   Exercise Vital Sign    Days of Exercise per Week: 0 days    Minutes of Exercise per Session: 0 min  Stress: No Stress Concern Present (05/24/2017)   [employer] of Occupational Health - Occupational Stress Questionnaire    Feeling of Stress : Only a little  Social Connections: Moderately Integrated (05/24/2017)   Social Connection and Isolation Panel    Frequency of Communication with Friends and Family: Three times a week    Frequency of Social Gatherings with Friends and Family: Once a week    Attends Religious Services: 1 to 4 times per year    Active Member of [employer] or Organizations: No    Attends Banker Meetings: Never    Marital Status: Married  Catering manager Violence: Not At Risk (05/24/2017)   Humiliation, Afraid, Rape, and Kick questionnaire    Fear of Current or Ex-Partner: No    Emotionally  Abused: No    Physically Abused: No    Sexually Abused: No    Family History  Problem Relation Age of Onset   Hypertension Mother     Allergies  Allergen Reactions   Pollen Extract     Outpatient Medications Prior to Visit  Medication Sig   carvedilol  (COREG ) 25 MG tablet TAKE 1 TABLET EVERY DAY (NEED AN APPOINTMENT WITH LABS)   RYBELSUS  7 MG TABS TAKE 1 TABLET (7 MG TOTAL) BY MOUTH DAILY   Bempedoic Acid-Ezetimibe (NEXLIZET ) 180-10 MG TABS Take 1 tablet by mouth daily. (Patient not taking: Reported on 11/28/2023)   metFORMIN (GLUCOPHAGE) 500 MG tablet Take 500 mg by mouth 2 (two) times daily. (Patient not taking: Reported on 11/28/2023)   [DISCONTINUED] oxyCODONE -acetaminophen  (PERCOCET) 5-325 MG tablet Take 1 tablet by mouth every 4 (four) hours as needed for severe pain (pain score 7-10). (Patient not taking: Reported on 11/28/2023)   No  facility-administered medications prior to visit.    Review of Systems  Constitutional:  Positive for weight loss (5 lbs).  HENT: Negative.    Eyes: Negative.   Respiratory: Negative.    Cardiovascular: Negative.   Gastrointestinal: Negative.   Genitourinary: Negative.   Skin: Negative.   Neurological: Negative.   Endo/Heme/Allergies: Negative.        Objective:   BP (!) 140/82   Pulse 75   Temp 97.8 F (36.6 C)   Ht 5' 10 (1.778 m)   Wt 234 lb 12.8 oz (106.5 kg)   SpO2 97%   BMI 33.69 kg/m   Vitals:   11/28/23 1025  BP: (!) 140/82  Pulse: 75  Temp: 97.8 F (36.6 C)  Height: 5' 10 (1.778 m)  Weight: 234 lb 12.8 oz (106.5 kg)  SpO2: 97%  BMI (Calculated): 33.69    Physical Exam Vitals reviewed.  Constitutional:      Appearance: Normal appearance. He is obese.  HENT:     Head: Normocephalic.     Left Ear: There is no impacted cerumen.     Nose: Nose normal.     Mouth/Throat:     Mouth: Mucous membranes are moist.     Pharynx: No posterior oropharyngeal erythema.  Eyes:     Extraocular Movements: Extraocular movements intact.     Pupils: Pupils are equal, round, and reactive to light.  Cardiovascular:     Rate and Rhythm: Regular rhythm.     Chest Wall: PMI is not displaced.     Pulses: Normal pulses.     Heart sounds: Normal heart sounds. No murmur heard. Pulmonary:     Effort: Pulmonary effort is normal.     Breath sounds: Normal air entry. No rhonchi or rales.  Abdominal:     General: Abdomen is flat. Bowel sounds are normal. There is no distension.     Palpations: Abdomen is soft. There is no hepatomegaly, splenomegaly or mass.     Tenderness: There is no abdominal tenderness.  Musculoskeletal:        General: No tenderness (over base of left thumb). Normal range of motion.     Cervical back: Normal range of motion and neck supple.     Right lower leg: No edema.     Left lower leg: No edema.  Skin:    General: Skin is warm and dry.   Neurological:     General: No focal deficit present.     Mental Status: He is alert and oriented to person, place, and time.  Cranial Nerves: No cranial nerve deficit.     Motor: No weakness.  Psychiatric:        Mood and Affect: Mood normal.        Behavior: Behavior normal.      No results found for any visits on 11/28/23.  Recent Results (from the past 2160 hours)  CMP14+EGFR     Status: Abnormal   Collection Time: 10/12/23 11:39 AM  Result Value Ref Range   Glucose 131 (H) 70 - 99 mg/dL   BUN 13 8 - 27 mg/dL   Creatinine, Ser 9.01 0.76 - 1.27 mg/dL   eGFR 81 >40 fO/fpw/8.26   BUN/Creatinine Ratio 13 10 - 24   Sodium 139 134 - 144 mmol/L   Potassium 4.2 3.5 - 5.2 mmol/L   Chloride 107 (H) 96 - 106 mmol/L   CO2 19 (L) 20 - 29 mmol/L   Calcium 9.0 8.6 - 10.2 mg/dL   Total Protein 6.4 6.0 - 8.5 g/dL   Albumin 3.9 3.8 - 4.8 g/dL   Globulin, Total 2.5 1.5 - 4.5 g/dL   Bilirubin Total 0.6 0.0 - 1.2 mg/dL   Alkaline Phosphatase 91 44 - 121 IU/L   AST 18 0 - 40 IU/L   ALT 14 0 - 44 IU/L  Hemoglobin A1c     Status: Abnormal   Collection Time: 10/12/23 11:39 AM  Result Value Ref Range   Hgb A1c MFr Bld 6.1 (H) 4.8 - 5.6 %    Comment:          Prediabetes: 5.7 - 6.4          Diabetes: >6.4          Glycemic control for adults with diabetes: <7.0    Est. average glucose Bld gHb Est-mCnc 128 mg/dL  Lipid panel     Status: Abnormal   Collection Time: 10/12/23 11:39 AM  Result Value Ref Range   Cholesterol, Total 180 100 - 199 mg/dL   Triglycerides 765 (H) 0 - 149 mg/dL   HDL 31 (L) >60 mg/dL   VLDL Cholesterol Cal 41 (H) 5 - 40 mg/dL   LDL Chol Calc (NIH) 891 (H) 0 - 99 mg/dL   Chol/HDL Ratio 5.8 (H) 0.0 - 5.0 ratio    Comment:                                   T. Chol/HDL Ratio                                             Men  Women                               1/2 Avg.Risk  3.4    3.3                                   Avg.Risk  5.0    4.4                                 2X Avg.Risk  9.6    7.1  3X Avg.Risk 23.4   11.0   POCT CBG (Fasting - Glucose)     Status: Abnormal   Collection Time: 10/17/23  9:46 AM  Result Value Ref Range   Glucose Fasting, POC 137 (A) 70 - 99 mg/dL      Assessment & Plan:  Mindy was seen today for annual exam.  Controlled type 2 diabetes mellitus without complication, without long-term current use of insulin (HCC)  Mixed hyperlipidemia  Primary hypertension    Problem List Items Addressed This Visit       Cardiovascular and Mediastinum   Primary hypertension     Endocrine   Controlled type 2 diabetes mellitus without complication, without long-term current use of insulin (HCC) - Primary     Other   Mixed hyperlipidemia    Return in 3 months (on 02/28/2024).   Total time spent: 30 minutes  Sherrill Cinderella Perry, MD  11/28/2023   This document may have been prepared by [facility] Voice Recognition software and as such may include unintentional dictation errors.

## 2023-11-29 ENCOUNTER — Ambulatory Visit: Payer: Self-pay | Admitting: Internal Medicine

## 2023-11-29 LAB — CBC WITH DIFF/PLATELET
Basophils Absolute: 0.1 x10E3/uL (ref 0.0–0.2)
Basos: 1 %
EOS (ABSOLUTE): 0.1 x10E3/uL (ref 0.0–0.4)
Eos: 2 %
Hematocrit: 43.6 % (ref 37.5–51.0)
Hemoglobin: 14.9 g/dL (ref 13.0–17.7)
Immature Grans (Abs): 0 x10E3/uL (ref 0.0–0.1)
Immature Granulocytes: 0 %
Lymphocytes Absolute: 1.9 x10E3/uL (ref 0.7–3.1)
Lymphs: 26 %
MCH: 30.8 pg (ref 26.6–33.0)
MCHC: 34.2 g/dL (ref 31.5–35.7)
MCV: 90 fL (ref 79–97)
Monocytes Absolute: 0.6 x10E3/uL (ref 0.1–0.9)
Monocytes: 9 %
Neutrophils Absolute: 4.6 x10E3/uL (ref 1.4–7.0)
Neutrophils: 62 %
Platelets: 182 x10E3/uL (ref 150–450)
RBC: 4.84 x10E6/uL (ref 4.14–5.80)
RDW: 13.1 % (ref 11.6–15.4)
WBC: 7.2 x10E3/uL (ref 3.4–10.8)

## 2023-11-29 LAB — FRUCTOSAMINE: Fructosamine: 229 umol/L (ref 0–285)

## 2023-11-29 LAB — COMPREHENSIVE METABOLIC PANEL WITH GFR
ALT: 22 IU/L (ref 0–44)
AST: 23 IU/L (ref 0–40)
Albumin: 4.2 g/dL (ref 3.8–4.8)
Alkaline Phosphatase: 119 IU/L (ref 47–123)
BUN/Creatinine Ratio: 11 (ref 10–24)
BUN: 13 mg/dL (ref 8–27)
Bilirubin Total: 0.5 mg/dL (ref 0.0–1.2)
CO2: 19 mmol/L — ABNORMAL LOW (ref 20–29)
Calcium: 9.1 mg/dL (ref 8.6–10.2)
Chloride: 107 mmol/L — ABNORMAL HIGH (ref 96–106)
Creatinine, Ser: 1.14 mg/dL (ref 0.76–1.27)
Globulin, Total: 2.7 g/dL (ref 1.5–4.5)
Glucose: 116 mg/dL — ABNORMAL HIGH (ref 70–99)
Potassium: 4.3 mmol/L (ref 3.5–5.2)
Sodium: 139 mmol/L (ref 134–144)
Total Protein: 6.9 g/dL (ref 6.0–8.5)
eGFR: 67 mL/min/1.73 (ref >59)

## 2023-11-29 LAB — PSA: Prostate Specific Ag, Serum: 1.7 ng/mL (ref 0.0–4.0)

## 2023-11-29 NOTE — Progress Notes (Signed)
Pt informed

## 2023-12-08 ENCOUNTER — Ambulatory Visit: Admitting: Podiatry

## 2023-12-11 NOTE — Progress Notes (Signed)
 Connor Stanley                                          MRN: [id number]   12/11/2023   The [facility] Quality Team Specialist reviewed this patient medical record for the purposes of chart review for care gap closure. The following were reviewed: chart review for care gap closure-diabetic eye exam and kidney health evaluation for diabetes:eGFR  and uACR.    [facility] Quality Team

## 2023-12-20 ENCOUNTER — Other Ambulatory Visit: Payer: Self-pay | Admitting: Internal Medicine

## 2023-12-20 DIAGNOSIS — E119 Type 2 diabetes mellitus without complications: Secondary | ICD-10-CM

## 2024-02-28 ENCOUNTER — Ambulatory Visit: Admitting: Internal Medicine

## 2024-02-28 ENCOUNTER — Encounter: Payer: Self-pay | Admitting: Internal Medicine

## 2024-02-28 VITALS — BP 164/88 | HR 62 | Temp 99.0°F | Ht 70.0 in | Wt 238.6 lb

## 2024-02-28 DIAGNOSIS — E1165 Type 2 diabetes mellitus with hyperglycemia: Secondary | ICD-10-CM | POA: Diagnosis not present

## 2024-02-28 DIAGNOSIS — I10 Essential (primary) hypertension: Secondary | ICD-10-CM | POA: Diagnosis not present

## 2024-02-28 DIAGNOSIS — Z1211 Encounter for screening for malignant neoplasm of colon: Secondary | ICD-10-CM | POA: Diagnosis not present

## 2024-02-28 DIAGNOSIS — E119 Type 2 diabetes mellitus without complications: Secondary | ICD-10-CM

## 2024-02-28 LAB — POCT CBG (FASTING - GLUCOSE)-MANUAL ENTRY: Glucose Fasting, POC: 152 mg/dL — AB (ref 70–99)

## 2024-02-28 NOTE — Progress Notes (Signed)
 "  Established Patient Office Visit  Subjective:  Patient ID: Connor Stanley, male    DOB: 1950/06/05  Age: 74 y.o. MRN: [id number]  Chief Complaint  Patient presents with   Follow-up    3 month follow up    No new complaints, here for lab review and medication refills. Failed to have previsit labs done. Connor Stanley will soon relocate to [place] permanently.    No other concerns at this time.   Past Medical History:  Diagnosis Date   A-fib (HCC)    Acid indigestion    Arthritis    Complication of anesthesia    HARD TO WAKE UP   Diabetes mellitus without complication (HCC)    GERD (gastroesophageal reflux disease)    RARE-NO MEDS   History of kidney stones    H/O   Hypertension    Leaky heart valve    PT STATES Connor Stanley HAD HEART CATH AND IT SHOWED ONE OF HIS VALVES DID NOT CLOSE PROPERLY-    Past Surgical History:  Procedure Laterality Date   CARDIAC CATHETERIZATION     CHOLECYSTECTOMY  04/29/14    Social History   Socioeconomic History   Marital status: Married    Spouse name: Not on file   Number of children: Not on file   Years of education: Not on file   Highest education level: Not on file  Occupational History   Not on file  Tobacco Use   Smoking status: Never   Smokeless tobacco: Never  Vaping Use   Vaping status: Never Used  Substance and Sexual Activity   Alcohol use: No    Alcohol/week: 0.0 standard drinks of alcohol   Drug use: No   Sexual activity: Not on file  Other Topics Concern   Not on file  Social History Narrative   Lives at home with family   Social Drivers of Health   Tobacco Use: Low Risk (02/28/2024)   Patient History    Smoking Tobacco Use: Never    Smokeless Tobacco Use: Never    Passive Exposure: Not on file  Financial Resource Strain: Not on file  Food Insecurity: Not on file  Transportation Needs: Not on file  Physical Activity: Not on file  Stress: Not on file  Social Connections: Not on file  Intimate Partner Violence: Not on file   Depression (PHQ2-9): Low Risk (11/28/2023)   Depression (PHQ2-9)    PHQ-2 Score: 0  Alcohol Screen: Not on file  Housing: Not on file  Utilities: Not on file  Health Literacy: Not on file    Family History  Problem Relation Age of Onset   Hypertension Mother     Allergies[1]  Show/hide medication list[2]  Review of Systems  Constitutional:  Negative for weight loss (4 lbs).  HENT: Negative.    Eyes: Negative.   Respiratory: Negative.    Cardiovascular: Negative.   Gastrointestinal: Negative.   Genitourinary: Negative.   Skin: Negative.   Neurological: Negative.   Endo/Heme/Allergies: Negative.        Objective:   BP (!) 164/88   Pulse 62   Temp 99 F (37.2 C) (Tympanic)   Ht 5' 10 (1.778 m)   Wt 238 lb 9.6 oz (108.2 kg)   SpO2 98%   BMI 34.24 kg/m   Vitals:   02/28/24 0929  BP: (!) 164/88  Pulse: 62  Temp: 99 F (37.2 C)  Height: 5' 10 (1.778 m)  Weight: 238 lb 9.6 oz (108.2 kg)  SpO2: 98%  TempSrc: Tympanic  BMI (Calculated): 34.24    Physical Exam Vitals reviewed.  Constitutional:      Appearance: Normal appearance. Connor Stanley is obese.  HENT:     Head: Normocephalic.     Left Ear: There is no impacted cerumen.     Nose: Nose normal.     Mouth/Throat:     Mouth: Mucous membranes are moist.     Pharynx: No posterior oropharyngeal erythema.  Eyes:     Extraocular Movements: Extraocular movements intact.     Pupils: Pupils are equal, round, and reactive to light.  Cardiovascular:     Rate and Rhythm: Regular rhythm.     Chest Wall: PMI is not displaced.     Pulses: Normal pulses.     Heart sounds: Normal heart sounds. No murmur heard. Pulmonary:     Effort: Pulmonary effort is normal.     Breath sounds: Normal air entry. No rhonchi or rales.  Abdominal:     General: Abdomen is flat. Bowel sounds are normal. There is no distension.     Palpations: Abdomen is soft. There is no hepatomegaly, splenomegaly or mass.     Tenderness: There is no  abdominal tenderness.  Musculoskeletal:        General: No tenderness (over base of left thumb). Normal range of motion.     Cervical back: Normal range of motion and neck supple.     Right lower leg: No edema.     Left lower leg: No edema.  Skin:    General: Skin is warm and dry.  Neurological:     General: No focal deficit present.     Mental Status: Connor Stanley is alert and oriented to person, place, and time.     Cranial Nerves: No cranial nerve deficit.     Motor: No weakness.  Psychiatric:        Mood and Affect: Mood normal.        Behavior: Behavior normal.      Results for orders placed or performed in visit on 02/28/24  POCT CBG (Fasting - Glucose)  Result Value Ref Range   Glucose Fasting, POC 152 (A) 70 - 99 mg/dL    Recent Results (from the past 2160 hours)  POCT CBG (Fasting - Glucose)     Status: Abnormal   Collection Time: 02/28/24  9:34 AM  Result Value Ref Range   Glucose Fasting, POC 152 (A) 70 - 99 mg/dL      Assessment & Plan:  Connor Stanley was seen today for follow-up.  Controlled type 2 diabetes mellitus without complication, without long-term current use of insulin (HCC) -     POCT CBG (Fasting - Glucose) -     Hemoglobin A1c  Primary hypertension  Colon cancer screening -     Cologuard    Problem List Items Addressed This Visit       Cardiovascular and Mediastinum   Primary hypertension     Endocrine   Controlled type 2 diabetes mellitus without complication, without long-term current use of insulin (HCC) - Primary   Relevant Orders   POCT CBG (Fasting - Glucose) (Completed)   Hemoglobin A1c   Other Visit Diagnoses       Colon cancer screening       Relevant Orders   Cologuard       Return in about 3 months (around 05/28/2024) for fu with labs prior.   Total time spent: 20 minutes. This time includes review of previous notes and results  and patient face to face interaction during today'Connor Stanley visit.    Sherrill Cinderella Perry,  MD  02/28/2024   This document may have been prepared by [facility] Voice Recognition software and as such may include unintentional dictation errors.     [1]  Allergies Allergen Reactions   Pollen Extract   [2]  Outpatient Medications Prior to Visit  Medication Sig   carvedilol  (COREG ) 25 MG tablet TAKE 1 TABLET EVERY DAY (NEED AN APPOINTMENT WITH LABS)   Semaglutide  (RYBELSUS ) 7 MG TABS Take 1 tablet (7 mg total) by mouth daily.   metFORMIN (GLUCOPHAGE) 500 MG tablet Take 500 mg by mouth 2 (two) times daily. (Patient not taking: Reported on 02/28/2024)   No facility-administered medications prior to visit.   "

## 2024-02-29 LAB — HEMOGLOBIN A1C
Est. average glucose Bld gHb Est-mCnc: 126 mg/dL
Hgb A1c MFr Bld: 6 % — ABNORMAL HIGH (ref 4.8–5.6)

## 2024-03-01 ENCOUNTER — Ambulatory Visit: Payer: Self-pay | Admitting: Internal Medicine

## 2024-03-18 NOTE — Progress Notes (Signed)
 Connor Stanley                                          MRN: [id number]   03/18/2024   The [facility] Quality Team Specialist reviewed this patient medical record for the purposes of chart review for care gap closure. The following were reviewed: abstraction for care gap closure-glycemic status assessment.    [facility]I Quality Team

## 2024-03-24 ENCOUNTER — Other Ambulatory Visit: Payer: Self-pay | Admitting: Internal Medicine

## 2024-03-24 DIAGNOSIS — E119 Type 2 diabetes mellitus without complications: Secondary | ICD-10-CM

## 2024-05-29 ENCOUNTER — Ambulatory Visit: Admitting: Internal Medicine
